# Patient Record
Sex: Female | Born: 1975
Health system: Southern US, Community
[De-identification: ages and names within clinical notes are randomized; demographics above are authoritative.]

## PROBLEM LIST (undated history)

## (undated) DIAGNOSIS — R112 Nausea with vomiting, unspecified: Secondary | ICD-10-CM

## (undated) DIAGNOSIS — Z9889 Other specified postprocedural states: Secondary | ICD-10-CM

## (undated) DIAGNOSIS — K219 Gastro-esophageal reflux disease without esophagitis: Secondary | ICD-10-CM

## (undated) DIAGNOSIS — R87629 Unspecified abnormal cytological findings in specimens from vagina: Secondary | ICD-10-CM

## (undated) DIAGNOSIS — R87619 Unspecified abnormal cytological findings in specimens from cervix uteri: Secondary | ICD-10-CM

## (undated) DIAGNOSIS — D649 Anemia, unspecified: Secondary | ICD-10-CM

## (undated) HISTORY — PX: CHOLECYSTECTOMY: SHX55

## (undated) HISTORY — PX: FOOT FRACTURE SURGERY: SHX645

## (undated) HISTORY — DX: Unspecified abnormal cytological findings in specimens from cervix uteri: R87.619

## (undated) HISTORY — PX: PILONIDAL CYST EXCISION: SHX744

---
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
## 2009-08-31 HISTORY — PX: DILATION AND CURETTAGE OF UTERUS: SHX78

## 2020-07-22 ENCOUNTER — Ambulatory Visit (INDEPENDENT_AMBULATORY_CARE_PROVIDER_SITE_OTHER): Payer: 59 | Admitting: Obstetrics and Gynecology

## 2020-07-22 ENCOUNTER — Other Ambulatory Visit: Payer: Self-pay

## 2020-07-22 ENCOUNTER — Encounter: Payer: Self-pay | Admitting: Obstetrics and Gynecology

## 2020-07-22 VITALS — BP 121/82 | HR 75 | Ht 67.0 in | Wt 209.0 lb

## 2020-07-22 DIAGNOSIS — N898 Other specified noninflammatory disorders of vagina: Secondary | ICD-10-CM

## 2020-07-22 DIAGNOSIS — R3915 Urgency of urination: Secondary | ICD-10-CM

## 2020-07-22 LAB — POCT URINALYSIS DIPSTICK: Blood, UA: NEGATIVE

## 2020-07-22 MED ORDER — NYSTATIN-TRIAMCINOLONE 100000-0.1 UNIT/GM-% EX OINT: 1.0000 "application " | TOPICAL_OINTMENT | Freq: Every day | CUTANEOUS | 1 refills | Status: DC

## 2020-07-22 NOTE — Progress Notes (Signed)
Urinary symptoms for about 10 days

## 2020-07-22 NOTE — Progress Notes (Signed)
Obstetrics and Gynecology New Patient Evaluation  Appointment Date: 07/22/2020  OBGYN Clinic: [facility] for [facility]  Primary Care Provider: No primary care provider on file.  Referring Provider: Self  Chief Complaint:  Chief Complaint  Patient presents with  . Urinary Frequency  and genital irritation  History of Present Illness: Stacy Barnes is a 44 y.o. G2P1011, seen for the above chief complaint. H  Patient with chronic history of uretheral/genital irritation. She states that she's had presumptive UTIs but only one (maybe two) actually came back positive on a urine culture. Her primary s/s is pain and irritation at the urethra with voiding. She also some pain with sexual intercourse. She saw a Dermatologist in the past and was put on clobetasol regimen that helped. S/s (uretheral) also helped with cutting down on caffeine and cola  Review of Systems: Pertinent items are noted in HPI.    Past Medical History:  Past Medical History:  Diagnosis Date  . Vaginal Pap smear, abnormal     Past Surgical History:  Past Surgical History:  Procedure Laterality Date  . DILATION AND CURETTAGE OF UTERUS  2011  . FOOT FRACTURE SURGERY    . PILONIDAL CYST EXCISION      Past Obstetrical History:  OB History  Gravida Para Term Preterm AB Living  2 1 1   1 1   SAB TAB Ectopic Multiple Live Births  1       1    # Outcome Date GA Lbr Len/2nd Weight Sex Delivery Anes PTL Lv  2 Term 11/17/11    F Vag-Spont   LIV  1 SAB 2011            Past Gynecological History: As per HPI. Periods: qmonth, regular History of Pap Smear(s): Yes.   Last pap 2020, which was negative per patient report She is currently using oral contraceptives (estrogen/progesterone) for contraception.   Social History:  Social History   Socioeconomic History  . Marital status: Married    Spouse name: Not on file  . Number of children: Not on file  . Years of education: Not on file  .  Highest education level: Not on file  Occupational History  . Not on file  Tobacco Use  . Smoking status: Never Smoker  . Smokeless tobacco: Never Used  Vaping Use  . Vaping Use: Never used  Substance and Sexual Activity  . Alcohol use: Not Currently  . Drug use: Not Currently  . Sexual activity: Yes    Birth control/protection: Pill  Other Topics Concern  . Not on file  Social History Narrative  . Not on file   Social Determinants of Health   Financial Resource Strain:   . Difficulty of Paying Living Expenses: Not on file  Food Insecurity:   . Worried About 2021 in the Last Year: Not on file  . Ran Out of Food in the Last Year: Not on file  Transportation Needs:   . Lack of Transportation (Medical): Not on file  . Lack of Transportation (Non-Medical): Not on file  Physical Activity:   . Days of Exercise per Week: Not on file  . Minutes of Exercise per Session: Not on file  Stress:   . Feeling of Stress : Not on file  Social Connections:   . Frequency of Communication with Friends and Family: Not on file  . Frequency of Social Gatherings with Friends and Family: Not on file  . Attends Religious Services: Not on  file  . Active Member of Clubs or Organizations: Not on file  . Attends Banker Meetings: Not on file  . Marital Status: Not on file  Intimate Partner Violence:   . Fear of Current or Ex-Partner: Not on file  . Emotionally Abused: Not on file  . Physically Abused: Not on file  . Sexually Abused: Not on file    Family History: History reviewed. No pertinent family history.   Medications Annie Paras had no medications administered during this visit. Current Outpatient Medications  Medication Sig Dispense Refill  . azelastine (ASTELIN) 0.1 % nasal spray Place into both nostrils 2 (two) times daily. Use in each nostril as directed    . cetirizine (ZYRTEC) 10 MG tablet Take 10 mg by mouth daily.    . fluticasone (FLONASE) 50  MCG/ACT nasal spray Place into both nostrils daily.    . norethindrone-ethinyl estradiol (LOESTRIN FE) 1-20 MG-MCG tablet Take 1 tablet by mouth daily.    Marland Kitchen omeprazole (PRILOSEC) 10 MG capsule Take 10 mg by mouth daily.     No current facility-administered medications for this visit.    Allergies Biaxin [clarithromycin] and Penicillins   Physical Exam:  BP 121/82   Pulse 75   Ht 5\' 7"  (1.702 m)   Wt 209 lb (94.8 kg)   BMI 32.73 kg/m  Body mass index is 32.73 kg/m. General appearance: Well nourished, well developed female in no acute distress.  Cardiovascular: normal s1 and s2.  No murmurs, rubs or gallops. Respiratory:  Clear to auscultation bilateral. Normal respiratory effort Abdomen: positive bowel sounds and no masses, hernias; diffusely non tender to palpation, non distended Neuro/Psych:  Normal mood and affect.  Skin:  Warm and dry.  Lymphatic:  No inguinal lymphadenopathy.   Pelvic exam: is not limited by body habitus EGBUS: urethera and peri-clitoral area erythematous and slightly ttp Vagina: appears irritated; no blood or d/c Cervix: normal appearing cervix without tenderness, discharge or lesions. Uterus:  nonenlarged and non tender Adnexa:  normal adnexa and no mass, fullness, tenderness Rectovaginal: deferred  Laboratory: poct u/a +leuks (moderate)  Radiology: none  Assessment: pt stable  Plan:  1. Vaginal,Uretheral irritation Her exam is notable for a hyperemic urethra and vaginal walls. Will send off for formal u/a and urine culture. I told her that I recommend mycolog ointment qhs until I see her back.   She did mention possible IC, which certainly may be going on, but I told her I'd recommend trying the ointment first before trying an IC diet and workup to try and not change too many things at once.  - Urinalysis, Routine w reflex microscopic - Urine Culture - NuSwab Vaginitis Plus (VG+)  Orders Placed This Encounter  Procedures  . Urine Culture  .  Microscopic Examination  . Urinalysis, Routine w reflex microscopic  . NuSwab Vaginitis Plus (VG+)  . POCT Urinalysis Dipstick   RTC 2 months for repeat exam  MD Attending Center for [facility])

## 2020-07-23 ENCOUNTER — Encounter: Payer: Self-pay | Admitting: Obstetrics and Gynecology

## 2020-07-23 LAB — URINALYSIS, ROUTINE W REFLEX MICROSCOPIC
Bilirubin, UA: NEGATIVE
Glucose, UA: NEGATIVE
Ketones, UA: NEGATIVE
Nitrite, UA: NEGATIVE
Protein,UA: NEGATIVE
RBC, UA: NEGATIVE
Specific Gravity, UA: 1.009 (ref 1.005–1.030)
Urobilinogen, Ur: 0.2 mg/dL (ref 0.2–1.0)
pH, UA: 6 (ref 5.0–7.5)

## 2020-07-23 LAB — MICROSCOPIC EXAMINATION
Bacteria, UA: NONE SEEN
Casts: NONE SEEN /lpf

## 2020-07-24 LAB — NUSWAB VAGINITIS PLUS (VG+)
Candida albicans, NAA: NEGATIVE
Candida glabrata, NAA: NEGATIVE
Chlamydia trachomatis, NAA: NEGATIVE
Neisseria gonorrhoeae, NAA: NEGATIVE
Trich vag by NAA: NEGATIVE

## 2020-07-24 LAB — URINE CULTURE

## 2020-08-06 ENCOUNTER — Encounter: Payer: Self-pay | Admitting: Radiology

## 2020-09-05 ENCOUNTER — Other Ambulatory Visit (HOSPITAL_COMMUNITY)
Admission: RE | Admit: 2020-09-05 | Discharge: 2020-09-05 | Disposition: A | Discharge status: Home / Self Care | Payer: 59 | Source: Ambulatory Visit | Attending: Obstetrics & Gynecology | Admitting: Obstetrics & Gynecology

## 2020-09-05 ENCOUNTER — Other Ambulatory Visit: Payer: Self-pay

## 2020-09-05 ENCOUNTER — Ambulatory Visit (INDEPENDENT_AMBULATORY_CARE_PROVIDER_SITE_OTHER): Payer: 59 | Admitting: Obstetrics & Gynecology

## 2020-09-05 ENCOUNTER — Encounter: Payer: Self-pay | Admitting: Obstetrics & Gynecology

## 2020-09-05 VITALS — BP 125/82 | HR 71 | Ht 67.0 in | Wt 206.4 lb

## 2020-09-05 DIAGNOSIS — N898 Other specified noninflammatory disorders of vagina: Secondary | ICD-10-CM | POA: Diagnosis present

## 2020-09-05 MED ORDER — TRIAMCINOLONE ACETONIDE 0.5 % EX CREA: 1.0000 "application " | TOPICAL_CREAM | CUTANEOUS | 3 refills | Status: DC

## 2020-09-05 NOTE — Progress Notes (Signed)
GYNECOLOGY OFFICE VISIT NOTE  History:   Stacy Barnes is a 45 y.o. G2P1011 here today for follow up after being treated for chronic vulvovaginitis by Dr. Vergie Living on 07/22/2020.  He was concerned by hyperemia of the urethra and vagina, prescribed course of Mycolog. Patient reports sone alleviation of symptoms, but still has irritation. No urinary symptoms anymore.  No other concerns.  She denies any abnormal vaginal bleeding, pelvic pain or other concerns.    Past Medical History:  Diagnosis Date  . Abnormal Pap smear of cervix     Past Surgical History:  Procedure Laterality Date  . DILATION AND CURETTAGE OF UTERUS  2011  . FOOT FRACTURE SURGERY    . PILONIDAL CYST EXCISION      The following portions of the patient's history were reviewed and updated as appropriate: allergies, current medications, past family history, past medical history, past social history, past surgical history and problem list.   Health Maintenance:  Normal pap and negative HRHPV in 2020 as per patient.    Review of Systems:  Pertinent items noted in HPI and remainder of comprehensive ROS otherwise negative.  Physical Exam:  BP 125/82   Pulse 71   Ht 5\' 7"  (1.702 m)   Wt 206 lb 6.4 oz (93.6 kg)   BMI 32.33 kg/m  CONSTITUTIONAL: Well-developed, well-nourished female in no acute distress.  HEENT:  Normocephalic, atraumatic. External right and left ear normal. No scleral icterus.  NECK: Normal range of motion, supple, no masses noted on observation SKIN: No rash noted. Not diaphoretic. No erythema. No pallor. MUSCULOSKELETAL: Normal range of motion. No edema noted. NEUROLOGIC: Alert and oriented to person, place, and time. Normal muscle tone coordination. No cranial nerve deficit noted. PSYCHIATRIC: Normal mood and affect. Normal behavior. Normal judgment and thought content. CARDIOVASCULAR: Normal heart rate noted RESPIRATORY: Effort and breath sounds normal, no problems with respiration  noted ABDOMEN: No masses noted. No other overt distention noted.   PELVIC: Some erythema noted on right side of urethra and mild erythema diffusely in vagina. Tender during exam. Thick yellow discharge seen in vagina, testing sample obtained.  Normal appearing cervix.  Performed in the presence of a chaperone.     Assessment and Plan:      1. Vaginal irritation 2. Vaginal discharge Patient reports improved symptoms.  But given persistent hyperemia, will switch to Kenalog at a higher potency for next few weeks.  If symptoms do not improve, consider switching to higher potency steroid such as Clobetasol (this has helped her in past) and can try concurrent vaginal estrogen therapy.  Symptoms could be in the spectrum of lichen chronicus simplex with atrophy; but she has no architectural distortion as is seen with lichen sclerosus. Continue to monitor. Of note, patient reported having biopsies in the past by previous GYN that were negative. Also will follow up vaginal discharge testing and manage accordingly.  - triamcinolone cream (KENALOG) 0.5 %; Apply 1 application topically 3 (three) times a week.  Dispense: 60 g; Refill: 3 - Cervicovaginal ancillary only( [place]) Routine preventative health maintenance measures emphasized. Please refer to After Visit Summary for other counseling recommendations.   Return in about 2 months (around 11/03/2020) for Followup vaginal irritation .    I spent 22 minutes dedicated to the care of this patient including pre-visit review of records, face to face time with the patient discussing her conditions and treatments and post visit ordering of testing.    01/03/2021, MD, FACOG Obstetrician &  Gynecologist, Product/process development scientist for [employer], [place]

## 2020-09-09 LAB — CERVICOVAGINAL ANCILLARY ONLY
Bacterial Vaginitis (gardnerella): NEGATIVE
Candida Glabrata: NEGATIVE
Candida Vaginitis: NEGATIVE
Comment: NEGATIVE
Comment: NEGATIVE
Comment: NEGATIVE
Comment: NEGATIVE
Trichomonas: NEGATIVE

## 2020-10-29 ENCOUNTER — Ambulatory Visit (INDEPENDENT_AMBULATORY_CARE_PROVIDER_SITE_OTHER): Payer: 59 | Admitting: Obstetrics & Gynecology

## 2020-10-29 ENCOUNTER — Other Ambulatory Visit: Payer: Self-pay

## 2020-10-29 ENCOUNTER — Encounter: Payer: Self-pay | Admitting: Obstetrics & Gynecology

## 2020-10-29 VITALS — BP 115/71 | HR 83 | Ht 67.0 in | Wt 207.8 lb

## 2020-10-29 DIAGNOSIS — Z1231 Encounter for screening mammogram for malignant neoplasm of breast: Secondary | ICD-10-CM | POA: Diagnosis not present

## 2020-10-29 DIAGNOSIS — N763 Subacute and chronic vulvitis: Secondary | ICD-10-CM | POA: Diagnosis not present

## 2020-10-29 DIAGNOSIS — N952 Postmenopausal atrophic vaginitis: Secondary | ICD-10-CM

## 2020-10-29 MED ORDER — ESTRADIOL 0.1 MG/GM VA CREA: TOPICAL_CREAM | VAGINAL | 12 refills | Status: DC

## 2020-10-29 MED ORDER — PREMARIN 0.625 MG/GM VA CREA: 1.0000 | TOPICAL_CREAM | Freq: Every day | VAGINAL | 12 refills | Status: DC

## 2020-10-29 NOTE — Patient Instructions (Signed)
Also ask about estradiol vaginal tablets.Marland Kitchenif this can be cheaper

## 2020-10-29 NOTE — Progress Notes (Signed)
   GYNECOLOGY OFFICE VISIT NOTE  History:   Stacy Barnes is a 45 y.o. G2P1011 here today for follow up for chronic vulvar inflammation. Examination findings last visit on 09/05/2020 were concerning for possible lichen chronicus simplex with atrophy. She had no architectural distortion as is seen with lichen sclerosus, and had negative biopsies in the past.  Symptoms alleviated by steroids in the past, so Kenalog was prescribed for patient to use three times a week.  She reports this helped with her symptoms, however she still reports vaginal dryness and pain with intercourse. She denies any abnormal vaginal discharge, bleeding, pelvic pain or other concerns.    Past Medical History:  Diagnosis Date  . Abnormal Pap smear of cervix     Past Surgical History:  Procedure Laterality Date  . DILATION AND CURETTAGE OF UTERUS  2011  . FOOT FRACTURE SURGERY    . PILONIDAL CYST EXCISION      The following portions of the patient's history were reviewed and updated as appropriate: allergies, current medications, past family history, past medical history, past social history, past surgical history and problem list.   Health Maintenance:  Normal pap and negative HRHPV in 11/2019 as per patient.   Review of Systems:  Pertinent items noted in HPI and remainder of comprehensive ROS otherwise negative.  Physical Exam:  BP 115/71   Pulse 83   Ht 5\' 7"  (1.702 m)   Wt 207 lb 12.8 oz (94.3 kg)   BMI 32.55 kg/m  CONSTITUTIONAL: Well-developed, well-nourished female in no acute distress.  MUSCULOSKELETAL: Normal range of motion. No edema noted. NEUROLOGIC: Alert and oriented to person, place, and time. Normal muscle tone coordination. No cranial nerve deficit noted. PSYCHIATRIC: Normal mood and affect. Normal behavior. Normal judgment and thought content. CARDIOVASCULAR: Normal heart rate noted RESPIRATORY: Effort and breath sounds normal, no problems with respiration noted ABDOMEN: No masses noted.  No other overt distention noted.   PELVIC: Mild erythema noted around urethra and introitus.  Improved from previous examination. Mild atrophy noted diffusely. Performed in the presence of a chaperone.     Assessment and Plan:      1. Chronic vulvitis Will continue Triamcinolone cream as prescribed.  2. Vaginal atrophy Counseled about trial of vaginal estrogen, this was prescribed for patient. Will evaluate response.  Should also help with pain during intercourse. - conjugated estrogens (PREMARIN) vaginal cream; Place 1 Applicatorful vaginally daily.  Dispense: 30 g; Refill: 12  3. Breast cancer screening by mammogram Mammogram ordered - MM 3D SCREEN BREAST BILATERAL; Future Routine preventative health maintenance measures emphasized, pap smear to be done next visit. Please refer to After Visit Summary for other counseling recommendations.   Return in about 8 weeks (around 12/23/2020) for Annual exam and followup.    I spent 15 minutes dedicated to the care of this patient including pre-visit review of records, face to face time with the patient discussing her conditions and treatments and post visit ordering of testing.    12/25/2020, MD, FACOG Obstetrician & Gynecologist, [facility] for [facility]

## 2020-10-30 ENCOUNTER — Other Ambulatory Visit: Payer: Self-pay | Admitting: *Deleted

## 2020-10-30 ENCOUNTER — Inpatient Hospital Stay
Admission: RE | Admit: 2020-10-30 | Discharge: 2020-10-30 | Disposition: A | Discharge status: Home / Self Care | Payer: Self-pay | Source: Ambulatory Visit | Attending: *Deleted | Admitting: *Deleted

## 2020-10-30 ENCOUNTER — Ambulatory Visit
Admission: RE | Admit: 2020-10-30 | Discharge: 2020-10-30 | Disposition: A | Discharge status: Home / Self Care | Payer: 59 | Source: Ambulatory Visit | Attending: Obstetrics & Gynecology | Admitting: Obstetrics & Gynecology

## 2020-10-30 ENCOUNTER — Other Ambulatory Visit: Payer: Self-pay

## 2020-10-30 DIAGNOSIS — Z1231 Encounter for screening mammogram for malignant neoplasm of breast: Secondary | ICD-10-CM

## 2020-11-03 ENCOUNTER — Telehealth: Payer: 59 | Admitting: Physician Assistant

## 2020-11-03 DIAGNOSIS — K2960 Other gastritis without bleeding: Secondary | ICD-10-CM

## 2020-11-03 MED ORDER — PANTOPRAZOLE SODIUM 40 MG PO TBEC: 40.0000 mg | DELAYED_RELEASE_TABLET | Freq: Every day | ORAL | 0 refills | Status: DC

## 2020-11-03 NOTE — Progress Notes (Signed)
[name], paccione are scheduled for a virtual visit with your provider today.    Just as we do with appointments in the office, we must obtain your consent to participate.  Your consent will be active for this visit and any virtual visit you may have with one of our providers in the next 365 days.    If you have a MyChart account, I can also send a copy of this consent to you electronically.  All virtual visits are billed to your insurance company just like a traditional visit in the office.  As this is a virtual visit, video technology does not allow for your provider to perform a traditional examination.  This may limit your provider's ability to fully assess your condition.  If your provider identifies any concerns that need to be evaluated in person or the need to arrange testing such as labs, EKG, etc, we will make arrangements to do so.    Although advances in technology are sophisticated, we cannot ensure that it will always work on either your end or our end.  If the connection with a video visit is poor, we may have to switch to a telephone visit.  With either a video or telephone visit, we are not always able to ensure that we have a secure connection.   I need to obtain your verbal consent now.   Are you willing to proceed with your visit today?   Stacy Barnes has provided verbal consent on 11/03/2020 for a virtual visit (video or telephone).   [address] [place], [place] 11/03/2020  3:20 PM  We are sorry that you are not feeling well.  Here is how we plan to help!  Based on what you shared with me it looks like you most likely have Gastroesophageal Reflux Disease (GERD)  Gastroesophageal reflux disease (GERD) happens when acid from your stomach flows up into the esophagus.  When acid comes in contact with the esophagus, the acid causes sorenss (inflammation) in the esophagus.  Over time, GERD may create small holes (ulcers) in the lining of the esophagus.  Stop taking NSAIDs.  I have  prescribed Pantoprazole 20 mg one by mouth daily until you follow up with a provider.  Your symptoms should improve in the next day or two.  You can use antacids as needed until symptoms resolve.  Call us if your heartburn worsens, you have trouble swallowing, weight loss, spitting up blood or recurrent vomiting.  Home Care:  May include lifestyle changes such as weight loss, quitting smoking and alcohol consumption  Avoid foods and drinks that make your symptoms worse, such as:  Caffeine or alcoholic drinks  Chocolate  Peppermint or mint flavorings  Garlic and onions  Spicy foods  Citrus fruits, such as oranges, lemons, or limes  Tomato-based foods such as sauce, chili, salsa and pizza  Fried and fatty foods  Avoid lying down for 3 hours prior to your bedtime or prior to taking a nap  Eat small, frequent meals instead of a large meals  Wear loose-fitting clothing.  Do not wear anything tight around your waist that causes pressure on your stomach.  Raise the head of your bed 6 to 8 inches with wood blocks to help you sleep.  Extra pillows will not help.  Seek Help Right Away If:  You have pain in your arms, neck, jaw, teeth or back  Your pain increases or changes in intensity or duration  You develop nausea, vomiting or sweating (diaphoresis)  You  develop shortness of breath or you faint  Your vomit is green, yellow, black or looks like coffee grounds or blood  Your stool is red, bloody or black  These symptoms could be signs of other problems, such as heart disease, gastric bleeding or esophageal bleeding.  Be sure to schedule an appointment with a primary care provider.   If you do not have a PCP, [facility] offers a free physician referral service available at [phone]. Our trained staff has the experience, knowledge and resources to put you in touch with a physician who is right for you.   You also have the option of a video visit through  [email or web address]    Make sure you :  Understand these instructions.  Will watch your condition.  Will get help right away if you are not doing well or get worse.  Your e-visit answers were reviewed by a board certified advanced clinical practitioner to complete your personal care plan.  Depending on the condition, your plan could have included both over the counter or prescription medications.  If there is a problem please reply  once you have received a response from your provider.  Your safety is important to [place].  If you have drug allergies check your prescription carefully.    You can use MyChart to ask questions about today's visit, request a non-urgent call back, or ask for a work or school excuse for 24 hours related to this e-Visit. If it has been greater than 24 hours you will need to follow up with your provider, or enter a new e-Visit to address those concerns.  You will get an e-mail in the next two days asking about your experience.  I hope that your e-visit has been valuable and will speed your recovery. Thank you for using e-visits.     Greater than 5 minutes, yet less than 10 minutes of time have been spent researching, coordinating and implementing care for this patient today.

## 2020-11-20 ENCOUNTER — Ambulatory Visit: Payer: 59 | Admitting: Internal Medicine

## 2020-11-20 ENCOUNTER — Encounter: Payer: Self-pay | Admitting: Internal Medicine

## 2020-11-20 ENCOUNTER — Ambulatory Visit (INDEPENDENT_AMBULATORY_CARE_PROVIDER_SITE_OTHER): Payer: 59 | Admitting: Internal Medicine

## 2020-11-20 ENCOUNTER — Other Ambulatory Visit: Payer: Self-pay

## 2020-11-20 VITALS — BP 124/72 | HR 82 | Ht 67.0 in | Wt 207.4 lb

## 2020-11-20 DIAGNOSIS — E8881 Metabolic syndrome: Secondary | ICD-10-CM

## 2020-11-20 DIAGNOSIS — K2100 Gastro-esophageal reflux disease with esophagitis, without bleeding: Secondary | ICD-10-CM | POA: Diagnosis not present

## 2020-11-20 MED ORDER — PANTOPRAZOLE SODIUM 40 MG PO TBEC
40.0000 mg | DELAYED_RELEASE_TABLET | Freq: Every day | ORAL | 4 refills | Status: AC
Start: 2020-11-20 — End: ?

## 2020-11-20 NOTE — Progress Notes (Signed)
New Patient Office Visit  Subjective:  Patient ID: Stacy Barnes, female    DOB: Nov 04, 1975  Age: 44 y.o. MRN: [id number]  CC:  Chief Complaint  Patient presents with  . New Patient (Initial Visit)    Patient here today to establish care and has complaints of acid reflux    Heartburn She complains of heartburn and water brash. She reports no abdominal pain, no belching, no chest pain, no choking, no coughing, no dysphagia or no wheezing. This is a recurrent problem. The heartburn duration is more than one hour. The heartburn is located in the substernum. The heartburn is of moderate intensity. The heartburn wakes her from sleep. The heartburn changes with position. Risk factors include obesity. She has tried head elevation, an antacid and a diet change for the symptoms.     Past Medical History:  Diagnosis Date  . Abnormal Pap smear of cervix      Current Outpatient Medications:  .  azelastine (ASTELIN) 0.1 % nasal spray, Place into both nostrils 2 (two) times daily. Use in each nostril as directed, Disp: , Rfl:  .  cetirizine (ZYRTEC) 10 MG tablet, Take 10 mg by mouth daily., Disp: , Rfl:  .  fluticasone (FLONASE) 50 MCG/ACT nasal spray, Place into both nostrils daily., Disp: , Rfl:  .  norethindrone-ethinyl estradiol (LOESTRIN FE) 1-20 MG-MCG tablet, Take 1 tablet by mouth daily., Disp: , Rfl:  .  triamcinolone cream (KENALOG) 0.5 %, Apply 1 application topically 3 (three) times a week., Disp: 60 g, Rfl: 3 .  conjugated estrogens (PREMARIN) vaginal cream, Place 1 Applicatorful vaginally daily., Disp: 30 g, Rfl: 12 .  pantoprazole (PROTONIX) 40 MG tablet, Take 1 tablet (40 mg total) by mouth daily., Disp: 60 tablet, Rfl: 4   Past Surgical History:  Procedure Laterality Date  . DILATION AND CURETTAGE OF UTERUS  2011  . FOOT FRACTURE SURGERY    . PILONIDAL CYST EXCISION      Family History  Problem Relation Age of Onset  . Breast cancer Maternal Aunt 79       died at age  58    Social History   Socioeconomic History  . Marital status: Married    Spouse name: Not on file  . Number of children: Not on file  . Years of education: Not on file  . Highest education level: Not on file  Occupational History  . Not on file  Tobacco Use  . Smoking status: Never Smoker  . Smokeless tobacco: Never Used  Vaping Use  . Vaping Use: Never used  Substance and Sexual Activity  . Alcohol use: Not Currently  . Drug use: Not Currently  . Sexual activity: Yes    Birth control/protection: Pill  Other Topics Concern  . Not on file  Social History Narrative  . Not on file   Social Determinants of Health   Financial Resource Strain: Not on file  Food Insecurity: Not on file  Transportation Needs: Not on file  Physical Activity: Not on file  Stress: Not on file  Social Connections: Not on file  Intimate Partner Violence: Not on file    ROS Review of Systems  Constitutional: Negative.   HENT: Negative.   Eyes: Negative.   Respiratory: Negative.  Negative for cough, choking and wheezing.   Cardiovascular: Negative.  Negative for chest pain.  Gastrointestinal: Positive for heartburn. Negative for abdominal pain and dysphagia.  Endocrine: Negative.   Genitourinary: Negative.   Musculoskeletal: Negative.  Skin: Negative.   Allergic/Immunologic: Negative.   Neurological: Negative.   Hematological: Negative.   Psychiatric/Behavioral: Negative.   All other systems reviewed and are negative.   Objective:   Today's Vitals: BP 124/72   Pulse 82   Ht 5\' 7"  (1.702 m)   Wt 207 lb 6.4 oz (94.1 kg)   BMI 32.48 kg/m   Physical Exam Constitutional:      Appearance: She is normal weight.  HENT:     Right Ear: Tympanic membrane normal.     Left Ear: Tympanic membrane normal.     Mouth/Throat:     Mouth: Mucous membranes are moist.  Eyes:     Pupils: Pupils are equal, round, and reactive to light.  Cardiovascular:     Rate and Rhythm: Normal rate.      Heart sounds: No murmur heard. No gallop.   Pulmonary:     Effort: No respiratory distress.     Breath sounds: No wheezing.  Abdominal:     General: Abdomen is flat.     Palpations: There is no mass.  Musculoskeletal:        General: Normal range of motion.  Skin:    General: Skin is warm.  Neurological:     General: No focal deficit present.     Mental Status: She is alert.  Psychiatric:        Mood and Affect: Mood normal.     Assessment & Plan:   Problem List Items Addressed This Visit      Digestive   Gastroesophageal reflux disease with esophagitis without hemorrhage - Primary    - The patient's GERD is stable on medication.  - Instructed the patient to avoid eating spicy and acidic foods, as well as foods high in fat. - Instructed the patient to avoid eating large meals or meals 2-3 hours prior to sleeping.      Relevant Medications   pantoprazole (PROTONIX) 40 MG tablet     Other   Metabolic syndrome    - I encouraged the patient to lose weight.  - I educated them on making healthy dietary choices including eating more fruits and vegetables and less fried foods. - I encouraged the patient to exercise more, and educated on the benefits of exercise including weight loss, diabetes prevention, and hypertension prevention.           Outpatient Encounter Medications as of 11/20/2020  Medication Sig  . azelastine (ASTELIN) 0.1 % nasal spray Place into both nostrils 2 (two) times daily. Use in each nostril as directed  . cetirizine (ZYRTEC) 10 MG tablet Take 10 mg by mouth daily.  . fluticasone (FLONASE) 50 MCG/ACT nasal spray Place into both nostrils daily.  . norethindrone-ethinyl estradiol (LOESTRIN FE) 1-20 MG-MCG tablet Take 1 tablet by mouth daily.  11/22/2020 triamcinolone cream (KENALOG) 0.5 % Apply 1 application topically 3 (three) times a week.  . [DISCONTINUED] meloxicam (MOBIC) 7.5 MG tablet Take 7.5 mg by mouth daily.  . [DISCONTINUED] omeprazole (PRILOSEC) 10  MG capsule Take 10 mg by mouth daily.  . [DISCONTINUED] pantoprazole (PROTONIX) 40 MG tablet Take 1 tablet (40 mg total) by mouth daily.  Marland Kitchen conjugated estrogens (PREMARIN) vaginal cream Place 1 Applicatorful vaginally daily.  . pantoprazole (PROTONIX) 40 MG tablet Take 1 tablet (40 mg total) by mouth daily.   No facility-administered encounter medications on file as of 11/20/2020.    Follow-up: No follow-ups on file.   11/22/2020, MD

## 2020-11-20 NOTE — Assessment & Plan Note (Signed)
-   The patient's GERD is stable on medication.  - Instructed the patient to avoid eating spicy and acidic foods, as well as foods high in fat. - Instructed the patient to avoid eating large meals or meals 2-3 hours prior to sleeping. 

## 2020-11-20 NOTE — Assessment & Plan Note (Signed)
-   I encouraged the patient to lose weight.  - I educated them on making healthy dietary choices including eating more fruits and vegetables and less fried foods. - I encouraged the patient to exercise more, and educated on the benefits of exercise including weight loss, diabetes prevention, and hypertension prevention.   

## 2020-11-21 ENCOUNTER — Other Ambulatory Visit: Payer: Self-pay | Admitting: *Deleted

## 2020-11-21 DIAGNOSIS — N898 Other specified noninflammatory disorders of vagina: Secondary | ICD-10-CM

## 2020-11-21 MED ORDER — NORETHIN ACE-ETH ESTRAD-FE 1-20 MG-MCG PO TABS: 1.0000 | ORAL_TABLET | Freq: Every day | ORAL | 2 refills | Status: DC

## 2020-11-28 ENCOUNTER — Other Ambulatory Visit: Payer: Self-pay

## 2020-11-28 ENCOUNTER — Other Ambulatory Visit (INDEPENDENT_AMBULATORY_CARE_PROVIDER_SITE_OTHER): Payer: 59

## 2020-11-28 DIAGNOSIS — E8881 Metabolic syndrome: Secondary | ICD-10-CM | POA: Diagnosis not present

## 2020-11-29 ENCOUNTER — Encounter: Payer: Self-pay | Admitting: *Deleted

## 2020-11-29 LAB — COMPLETE METABOLIC PANEL WITH GFR
AG Ratio: 1.6 (calc) (ref 1.0–2.5)
ALT: 9 U/L (ref 6–29)
AST: 10 U/L (ref 10–30)
Albumin: 3.9 g/dL (ref 3.6–5.1)
Alkaline phosphatase (APISO): 91 U/L (ref 31–125)
BUN: 8 mg/dL (ref 7–25)
CO2: 22 mmol/L (ref 20–32)
Calcium: 9.3 mg/dL (ref 8.6–10.2)
Chloride: 105 mmol/L (ref 98–110)
Creat: 0.87 mg/dL (ref 0.50–1.10)
GFR, Est African American: 95 mL/min/{1.73_m2} (ref >=60)
GFR, Est Non African American: 82 mL/min/{1.73_m2} (ref >=60)
Globulin: 2.5 g/dL (calc) (ref 1.9–3.7)
Glucose, Bld: 82 mg/dL (ref 65–99)
Potassium: 4 mmol/L (ref 3.5–5.3)
Sodium: 138 mmol/L (ref 135–146)
Total Bilirubin: 0.5 mg/dL (ref 0.2–1.2)
Total Protein: 6.4 g/dL (ref 6.1–8.1)

## 2020-11-29 LAB — LIPID PANEL
Cholesterol: 197 mg/dL (ref <200)
HDL: 50 mg/dL (ref >=50)
LDL Cholesterol (Calc): 123 mg/dL (calc) — ABNORMAL HIGH
Non-HDL Cholesterol (Calc): 147 mg/dL (calc) — ABNORMAL HIGH (ref <130)
Total CHOL/HDL Ratio: 3.9 (calc) (ref <5.0)
Triglycerides: 127 mg/dL (ref <150)

## 2020-11-29 LAB — CBC WITH DIFFERENTIAL/PLATELET
Absolute Monocytes: 433 cells/uL (ref 200–950)
Basophils Absolute: 30 cells/uL (ref 0–200)
Basophils Relative: 0.4 %
Eosinophils Absolute: 228 cells/uL (ref 15–500)
Eosinophils Relative: 3 %
HCT: 37.6 % (ref 35.0–45.0)
Hemoglobin: 12.2 g/dL (ref 11.7–15.5)
Lymphs Abs: 2918 cells/uL (ref 850–3900)
MCH: 28 pg (ref 27.0–33.0)
MCHC: 32.4 g/dL (ref 32.0–36.0)
MCV: 86.2 fL (ref 80.0–100.0)
MPV: 11.6 fL (ref 7.5–12.5)
Monocytes Relative: 5.7 %
Neutro Abs: 3990 cells/uL (ref 1500–7800)
Neutrophils Relative %: 52.5 %
Platelets: 276 10*3/uL (ref 140–400)
RBC: 4.36 10*6/uL (ref 3.80–5.10)
RDW: 12.3 % (ref 11.0–15.0)
Total Lymphocyte: 38.4 %
WBC: 7.6 10*3/uL (ref 3.8–10.8)

## 2020-11-29 LAB — TSH: TSH: 3.09 mIU/L

## 2020-12-04 ENCOUNTER — Other Ambulatory Visit: Payer: Self-pay

## 2020-12-04 ENCOUNTER — Ambulatory Visit (INDEPENDENT_AMBULATORY_CARE_PROVIDER_SITE_OTHER): Payer: 59 | Admitting: Internal Medicine

## 2020-12-04 ENCOUNTER — Encounter: Payer: Self-pay | Admitting: Internal Medicine

## 2020-12-04 VITALS — BP 104/63 | HR 72 | Ht 67.0 in | Wt 208.1 lb

## 2020-12-04 DIAGNOSIS — K2100 Gastro-esophageal reflux disease with esophagitis, without bleeding: Secondary | ICD-10-CM | POA: Diagnosis not present

## 2020-12-04 DIAGNOSIS — E8881 Metabolic syndrome: Secondary | ICD-10-CM

## 2020-12-04 DIAGNOSIS — J301 Allergic rhinitis due to pollen: Secondary | ICD-10-CM | POA: Diagnosis not present

## 2020-12-04 NOTE — Assessment & Plan Note (Signed)
-   The patient's GERD is stable on medication.  - Instructed the patient to avoid eating spicy and acidic foods, as well as foods high in fat. - Instructed the patient to avoid eating large meals or meals 2-3 hours prior to sleeping. 

## 2020-12-04 NOTE — Assessment & Plan Note (Signed)
Take Claritin 10 mg p.o. daily in the season

## 2020-12-04 NOTE — Assessment & Plan Note (Signed)

## 2020-12-04 NOTE — Progress Notes (Signed)
Established Patient Office Visit  Subjective:  Patient ID: Stacy Barnes, female    DOB: 1976-02-28  Age: 45 y.o. MRN: [id number]  CC:  Chief Complaint  Patient presents with  . follow up lab results     HPI  Stacy Barnes presents for general checkup. Past Medical History:  Diagnosis Date  . Abnormal Pap smear of cervix     Past Surgical History:  Procedure Laterality Date  . DILATION AND CURETTAGE OF UTERUS  2011  . FOOT FRACTURE SURGERY    . PILONIDAL CYST EXCISION      Family History  Problem Relation Age of Onset  . Breast cancer Maternal Aunt 44       died at age 75    Social History   Socioeconomic History  . Marital status: Married    Spouse name: Not on file  . Number of children: Not on file  . Years of education: Not on file  . Highest education level: Not on file  Occupational History  . Not on file  Tobacco Use  . Smoking status: Never Smoker  . Smokeless tobacco: Never Used  Vaping Use  . Vaping Use: Never used  Substance and Sexual Activity  . Alcohol use: Not Currently  . Drug use: Not Currently  . Sexual activity: Yes    Birth control/protection: Pill  Other Topics Concern  . Not on file  Social History Narrative  . Not on file   Social Determinants of Health   Financial Resource Strain: Not on file  Food Insecurity: Not on file  Transportation Needs: Not on file  Physical Activity: Not on file  Stress: Not on file  Social Connections: Not on file  Intimate Partner Violence: Not on file     Current Outpatient Medications:  .  azelastine (ASTELIN) 0.1 % nasal spray, Place into both nostrils 2 (two) times daily. Use in each nostril as directed, Disp: , Rfl:  .  cetirizine (ZYRTEC) 10 MG tablet, Take 10 mg by mouth daily., Disp: , Rfl:  .  fluticasone (FLONASE) 50 MCG/ACT nasal spray, Place into both nostrils daily., Disp: , Rfl:  .  norethindrone-ethinyl estradiol (LOESTRIN FE) 1-20 MG-MCG tablet, Take 1 tablet by mouth  daily., Disp: 28 tablet, Rfl: 2 .  pantoprazole (PROTONIX) 40 MG tablet, Take 1 tablet (40 mg total) by mouth daily., Disp: 60 tablet, Rfl: 4 .  triamcinolone cream (KENALOG) 0.5 %, Apply 1 application topically 3 (three) times a week., Disp: 60 g, Rfl: 3 .  conjugated estrogens (PREMARIN) vaginal cream, Place 1 Applicatorful vaginally daily., Disp: 30 g, Rfl: 12   Allergies  Allergen Reactions  . Biaxin [Clarithromycin] Nausea And Vomiting  . Penicillins Rash    ROS Review of Systems  Constitutional: Negative.   HENT: Negative.   Eyes: Negative.   Respiratory: Negative.   Cardiovascular: Negative.   Gastrointestinal: Negative.   Endocrine: Negative.   Genitourinary: Negative.   Musculoskeletal: Negative.   Skin: Negative.   Allergic/Immunologic: Negative.   Neurological: Negative.   Hematological: Negative.   Psychiatric/Behavioral: Negative.   All other systems reviewed and are negative.     Objective:    Physical Exam Vitals reviewed.  Constitutional:      Appearance: Normal appearance.  HENT:     Mouth/Throat:     Mouth: Mucous membranes are moist.  Eyes:     Pupils: Pupils are equal, round, and reactive to light.  Neck:     Vascular: No carotid bruit.  Cardiovascular:     Rate and Rhythm: Normal rate and regular rhythm.     Pulses: Normal pulses.     Heart sounds: Normal heart sounds.  Pulmonary:     Effort: Pulmonary effort is normal.     Breath sounds: Normal breath sounds.  Abdominal:     General: Bowel sounds are normal.     Palpations: Abdomen is soft. There is no hepatomegaly, splenomegaly or mass.     Tenderness: There is no abdominal tenderness.     Hernia: No hernia is present.  Musculoskeletal:        General: No tenderness.     Cervical back: Neck supple.     Right lower leg: No edema.     Left lower leg: No edema.  Skin:    Findings: No rash.  Neurological:     Mental Status: She is alert and oriented to person, place, and time.      Motor: No weakness.  Psychiatric:        Mood and Affect: Mood and affect normal.        Behavior: Behavior normal.     BP 104/63   Pulse 72   Ht 5\' 7"  (1.702 m)   Wt 208 lb 1.6 oz (94.4 kg)   BMI 32.59 kg/m  Wt Readings from Last 3 Encounters:  12/04/20 208 lb 1.6 oz (94.4 kg)  11/20/20 207 lb 6.4 oz (94.1 kg)  10/29/20 207 lb 12.8 oz (94.3 kg)     There are no preventive care reminders to display for this patient.  There are no preventive care reminders to display for this patient.  Lab Results  Component Value Date   TSH 3.09 11/28/2020   Lab Results  Component Value Date   WBC 7.6 11/28/2020   HGB 12.2 11/28/2020   HCT 37.6 11/28/2020   MCV 86.2 11/28/2020   PLT 276 11/28/2020   Lab Results  Component Value Date   NA 138 11/28/2020   K 4.0 11/28/2020   CO2 22 11/28/2020   GLUCOSE 82 11/28/2020   BUN 8 11/28/2020   CREATININE 0.87 11/28/2020   BILITOT 0.5 11/28/2020   AST 10 11/28/2020   ALT 9 11/28/2020   PROT 6.4 11/28/2020   CALCIUM 9.3 11/28/2020   Lab Results  Component Value Date   CHOL 197 11/28/2020   Lab Results  Component Value Date   HDL 50 11/28/2020   Lab Results  Component Value Date   LDLCALC 123 (H) 11/28/2020   Lab Results  Component Value Date   TRIG 127 11/28/2020   Lab Results  Component Value Date   CHOLHDL 3.9 11/28/2020   No results found for: HGBA1C    Assessment & Plan:   Problem List Items Addressed This Visit      Respiratory   Seasonal allergic rhinitis due to pollen    Take Claritin 10 mg p.o. daily in the season        Digestive   Gastroesophageal reflux disease with esophagitis without hemorrhage - Primary    - The patient's GERD is stable on medication.  - Instructed the patient to avoid eating spicy and acidic foods, as well as foods high in fat. - Instructed the patient to avoid eating large meals or meals 2-3 hours prior to sleeping.        Other   Metabolic syndrome    - I encouraged  the patient to lose weight.  - I educated them on making healthy dietary  choices including eating more fruits and vegetables and less fried foods. - I encouraged the patient to exercise more, and educated on the benefits of exercise including weight loss, diabetes prevention, and hypertension prevention.   Dietary counseling with a registered dietician  Referral to a weight management support group (e.g. Weight Watchers, Overeaters Anonymous)  If your BMI is greater than 29 or you have gained more than 15 pounds you should work on weight loss.  Attend a healthy cooking class        Hypercholesterolemia  I advised the patient to follow Mediterranean diet This diet is rich in fruits vegetables and whole grain, and This diet is also rich in fish and lean meat Patient should also eat a handful of almonds or walnuts daily Recent heart study indicated that average follow-up on this kind of diet reduces the cardiovascular mortality by 50 to 70%==  No orders of the defined types were placed in this encounter.   Follow-up: No follow-ups on file.    Corky Downs, MD

## 2020-12-24 ENCOUNTER — Other Ambulatory Visit (HOSPITAL_COMMUNITY)
Admission: RE | Admit: 2020-12-24 | Discharge: 2020-12-24 | Disposition: A | Discharge status: Home / Self Care | Payer: 59 | Source: Ambulatory Visit | Attending: Obstetrics & Gynecology | Admitting: Obstetrics & Gynecology

## 2020-12-24 ENCOUNTER — Ambulatory Visit (INDEPENDENT_AMBULATORY_CARE_PROVIDER_SITE_OTHER): Payer: 59 | Admitting: Obstetrics & Gynecology

## 2020-12-24 ENCOUNTER — Other Ambulatory Visit: Payer: Self-pay

## 2020-12-24 ENCOUNTER — Encounter: Payer: Self-pay | Admitting: Obstetrics & Gynecology

## 2020-12-24 VITALS — BP 131/83 | HR 76 | Ht 67.0 in | Wt 208.4 lb

## 2020-12-24 DIAGNOSIS — N898 Other specified noninflammatory disorders of vagina: Secondary | ICD-10-CM | POA: Insufficient documentation

## 2020-12-24 DIAGNOSIS — N763 Subacute and chronic vulvitis: Secondary | ICD-10-CM

## 2020-12-24 DIAGNOSIS — Z3041 Encounter for surveillance of contraceptive pills: Secondary | ICD-10-CM | POA: Diagnosis not present

## 2020-12-24 DIAGNOSIS — Z124 Encounter for screening for malignant neoplasm of cervix: Secondary | ICD-10-CM

## 2020-12-24 MED ORDER — TRIAMCINOLONE ACETONIDE 0.5 % EX CREA: 1.0000 "application " | TOPICAL_CREAM | CUTANEOUS | 5 refills | Status: AC

## 2020-12-24 MED ORDER — NORETHIN ACE-ETH ESTRAD-FE 1-20 MG-MCG PO TABS: 1.0000 | ORAL_TABLET | Freq: Every day | ORAL | 5 refills | Status: DC

## 2020-12-24 NOTE — Progress Notes (Signed)
   GYNECOLOGY OFFICE VISIT NOTE  History:   Stacy Barnes is a 45 y.o. G2P1011 here today for one episode of vaginal irritation noted during Kenalog application.  Felt irritated on the inside, not the outside. Had similar reaction but on inside and outside when she tried estrogen cream, so she discontinued this.  For the Kenalog, symptoms improved when she wiped it off the inside part.  She denies any abnormal vaginal discharge, bleeding, pelvic pain or other concerns. Also desires BC refill.    Past Medical History:  Diagnosis Date  . Abnormal Pap smear of cervix     Past Surgical History:  Procedure Laterality Date  . DILATION AND CURETTAGE OF UTERUS  2011  . FOOT FRACTURE SURGERY    . PILONIDAL CYST EXCISION      The following portions of the patient's history were reviewed and updated as appropriate: allergies, current medications, past family history, past medical history, past social history, past surgical history and problem list.   Health Maintenance:  Normal pap and negative HRHPV on 12/05/2019.  Normal mammogram on 11/01/2020.   Review of Systems:  Pertinent items noted in HPI and remainder of comprehensive ROS otherwise negative.  Physical Exam:  BP 131/83   Pulse 76   Ht 5\' 7"  (1.702 m)   Wt 208 lb 6.4 oz (94.5 kg)   BMI 32.64 kg/m  CONSTITUTIONAL: Well-developed, well-nourished female in no acute distress.  HEENT:  Normocephalic, atraumatic. External right and left ear normal. No scleral icterus.  NECK: Normal range of motion, supple, no masses noted on observation SKIN: No rash noted. Not diaphoretic. No erythema. No pallor. MUSCULOSKELETAL: Normal range of motion. No edema noted. NEUROLOGIC: Alert and oriented to person, place, and time. Normal muscle tone coordination. No cranial nerve deficit noted. PSYCHIATRIC: Normal mood and affect. Normal behavior. Normal judgment and thought content. CARDIOVASCULAR: Normal heart rate noted RESPIRATORY: Effort and breath  sounds normal, no problems with respiration noted ABDOMEN: No masses noted. No other overt distention noted.   PELVIC: Very mild erythema noted around urethra and introitus, markedly improved from previous examinations. Mild atrophy noted diffusely. Yellow, thin discharge noted in distal vagina, testing sample obtained. Performed in the presence of a chaperone.    Assessment and Plan:      1. Vaginal irritation 2. Chronic vulvitis Possible vaginitis episode, will follow up results and manage accordingly. Will continue external application of Kenalog for now, can reduce to two times a week if needed.  - triamcinolone cream (KENALOG) 0.5 %; Apply 1 application topically 3 (three) times a week.  Dispense: 60 g; Refill: 5 - Cervicovaginal ancillary only  3. Uses oral contraception OCPs refilled - norethindrone-ethinyl estradiol (LOESTRIN FE) 1-20 MG-MCG tablet; Take 1 tablet by mouth daily.  Dispense: 84 tablet; Refill: 5  4. Pap smear for cervical cancer screening Up to date on pap smear screening. Result abstracted from outside records today.  - Pap IG and HPV (high risk) DNA detection Routine preventative health maintenance measures emphasized. Please refer to After Visit Summary for other counseling recommendations.   Return for any gynecologic concerns.    I spent 18 minutes dedicated to the care of this patient including pre-visit review of records, face to face time with the patient discussing her conditions and treatments and post visit ordering of testing.    , MD, FACOG Obstetrician & Gynecologist, [facility]

## 2020-12-25 LAB — CERVICOVAGINAL ANCILLARY ONLY
Bacterial Vaginitis (gardnerella): NEGATIVE
Candida Glabrata: NEGATIVE
Candida Vaginitis: NEGATIVE
Comment: NEGATIVE
Comment: NEGATIVE
Comment: NEGATIVE
Comment: NEGATIVE
Trichomonas: NEGATIVE

## 2021-02-13 ENCOUNTER — Ambulatory Visit (HOSPITAL_COMMUNITY)
Admission: EM | Admit: 2021-02-13 | Discharge: 2021-02-13 | Disposition: A | Discharge status: Home / Self Care | Payer: 59 | Attending: Urgent Care | Admitting: Urgent Care

## 2021-02-13 ENCOUNTER — Other Ambulatory Visit: Payer: Self-pay

## 2021-02-13 ENCOUNTER — Encounter (HOSPITAL_COMMUNITY): Payer: Self-pay

## 2021-02-13 DIAGNOSIS — W5503XA Scratched by cat, initial encounter: Secondary | ICD-10-CM

## 2021-02-13 DIAGNOSIS — M25532 Pain in left wrist: Secondary | ICD-10-CM

## 2021-02-13 DIAGNOSIS — S50812A Abrasion of left forearm, initial encounter: Secondary | ICD-10-CM

## 2021-02-13 MED ORDER — AZITHROMYCIN 250 MG PO TABS: ORAL_TABLET | ORAL | 0 refills | Status: DC

## 2021-02-13 NOTE — ED Provider Notes (Signed)
St[facility]   MRN: [id number] DOB: 10-08-1975  Subjective:   Stacy Barnes is a 45 y.o. female presenting for suffering a cat bite this morning.  Patient states that it was from a kitten from an outdoor cat that she does not know.  Cat scratched her on her left hand.  She has kept it clean and covered.  Has concerns about rabies and other infections.  Denies fever, nausea, vomiting, drainage of pus or bleeding.  No current facility-administered medications for this encounter.  Current Outpatient Medications:    azelastine (ASTELIN) 0.1 % nasal spray, Place into both nostrils 2 (two) times daily. Use in each nostril as directed, Disp: , Rfl:    cetirizine (ZYRTEC) 10 MG tablet, Take 10 mg by mouth daily., Disp: , Rfl:    fluticasone (FLONASE) 50 MCG/ACT nasal spray, Place into both nostrils daily., Disp: , Rfl:    norethindrone-ethinyl estradiol (LOESTRIN FE) 1-20 MG-MCG tablet, Take 1 tablet by mouth daily., Disp: 84 tablet, Rfl: 5   pantoprazole (PROTONIX) 40 MG tablet, Take 1 tablet (40 mg total) by mouth daily., Disp: 60 tablet, Rfl: 4   triamcinolone cream (KENALOG) 0.5 %, Apply 1 application topically 3 (three) times a week., Disp: 60 g, Rfl: 5   Allergies  Allergen Reactions   Biaxin [Clarithromycin] Nausea And Vomiting   Penicillins Rash    Past Medical History:  Diagnosis Date   Abnormal Pap smear of cervix      Past Surgical History:  Procedure Laterality Date   DILATION AND CURETTAGE OF UTERUS  2011   FOOT FRACTURE SURGERY     PILONIDAL CYST EXCISION      Family History  Problem Relation Age of Onset   Breast cancer Maternal Aunt 100       died at age 64    Social History   Tobacco Use   Smoking status: Never   Smokeless tobacco: Never  Vaping Use   Vaping Use: Never used  Substance Use Topics   Alcohol use: Not Currently   Drug use: Not Currently    ROS   Objective:   Vitals: BP 122/84 (BP Location: Right Arm)   Pulse 71    Temp 98.9 F (37.2 C) (Oral)   Resp 16   SpO2 98%   Physical Exam Constitutional:      General: She is not in acute distress.    Appearance: Normal appearance. She is well-developed. She is not ill-appearing, toxic-appearing or diaphoretic.  HENT:     Head: Normocephalic and atraumatic.     Nose: Nose normal.     Mouth/Throat:     Mouth: Mucous membranes are moist.     Pharynx: Oropharynx is clear.  Eyes:     General: No scleral icterus.    Extraocular Movements: Extraocular movements intact.     Pupils: Pupils are equal, round, and reactive to light.  Cardiovascular:     Rate and Rhythm: Normal rate.  Pulmonary:     Effort: Pulmonary effort is normal.  Chest:  Breasts:    Right: No axillary adenopathy or supraclavicular adenopathy.     Left: No axillary adenopathy or supraclavicular adenopathy.  Musculoskeletal:       Hands:  Lymphadenopathy:     Upper Body:     Right upper body: No supraclavicular, axillary, pectoral or epitrochlear adenopathy.     Left upper body: No supraclavicular, axillary, pectoral or epitrochlear adenopathy.  Skin:    General: Skin is warm and  dry.  Neurological:     General: No focal deficit present.     Mental Status: She is alert and oriented to person, place, and time.  Psychiatric:        Mood and Affect: Mood normal.        Behavior: Behavior normal.        Thought Content: Thought content normal.        Judgment: Judgment normal.      Assessment and Plan :   PDMP not reviewed this encounter.  1. Left wrist pain   2. Cat scratch of forearm, left, initial encounter     Given that this was a juvenile cat that likely has vaccinations we discussed possibility of rabies.  I offered her prescription for azithromycin for cat scratch disease.  Recommended supportive care, discussed wound care otherwise. Counseled patient on potential for adverse effects with medications prescribed/recommended today, ER and return-to-clinic precautions  discussed, patient verbalized understanding.    Stacy Barnes, [place] 02/17/21 [phone]

## 2021-02-13 NOTE — ED Triage Notes (Signed)
Pt reports she was scratched by a outdoor cat in the left hand this morning.   Pt reports she had an allergy test dione today.

## 2021-03-12 ENCOUNTER — Emergency Department: Payer: 59

## 2021-03-12 ENCOUNTER — Emergency Department
Admission: EM | Admit: 2021-03-12 | Discharge: 2021-03-12 | Disposition: A | Discharge status: Home / Self Care | Payer: 59 | Attending: Emergency Medicine | Admitting: Emergency Medicine

## 2021-03-12 ENCOUNTER — Encounter: Payer: Self-pay | Admitting: Emergency Medicine

## 2021-03-12 ENCOUNTER — Other Ambulatory Visit: Payer: Self-pay

## 2021-03-12 DIAGNOSIS — R197 Diarrhea, unspecified: Secondary | ICD-10-CM | POA: Diagnosis not present

## 2021-03-12 DIAGNOSIS — R1013 Epigastric pain: Secondary | ICD-10-CM | POA: Diagnosis not present

## 2021-03-12 DIAGNOSIS — K219 Gastro-esophageal reflux disease without esophagitis: Secondary | ICD-10-CM | POA: Insufficient documentation

## 2021-03-12 DIAGNOSIS — R1011 Right upper quadrant pain: Secondary | ICD-10-CM | POA: Insufficient documentation

## 2021-03-12 DIAGNOSIS — R001 Bradycardia, unspecified: Secondary | ICD-10-CM | POA: Insufficient documentation

## 2021-03-12 DIAGNOSIS — R112 Nausea with vomiting, unspecified: Secondary | ICD-10-CM | POA: Insufficient documentation

## 2021-03-12 LAB — CBC
HCT: 39.5 % (ref 36.0–46.0)
Hemoglobin: 13 g/dL (ref 12.0–15.0)
MCH: 27.9 pg (ref 26.0–34.0)
MCHC: 32.9 g/dL (ref 30.0–36.0)
MCV: 84.8 fL (ref 80.0–100.0)
Platelets: 314 10*3/uL (ref 150–400)
RBC: 4.66 MIL/uL (ref 3.87–5.11)
RDW: 12.4 % (ref 11.5–15.5)
WBC: 12.4 10*3/uL — ABNORMAL HIGH (ref 4.0–10.5)
nRBC: 0 % (ref 0.0–0.2)

## 2021-03-12 LAB — URINALYSIS, COMPLETE (UACMP) WITH MICROSCOPIC
Bilirubin Urine: NEGATIVE
Glucose, UA: NEGATIVE mg/dL
Ketones, ur: 80 mg/dL — AB
Nitrite: NEGATIVE
Protein, ur: NEGATIVE mg/dL
Specific Gravity, Urine: 1.016 (ref 1.005–1.030)
pH: 5 (ref 5.0–8.0)

## 2021-03-12 LAB — COMPREHENSIVE METABOLIC PANEL
ALT: 18 U/L (ref 0–44)
AST: 16 U/L (ref 15–41)
Albumin: 4.1 g/dL (ref 3.5–5.0)
Alkaline Phosphatase: 86 U/L (ref 38–126)
Anion gap: 13 (ref 5–15)
BUN: 8 mg/dL (ref 6–20)
CO2: 22 mmol/L (ref 22–32)
Calcium: 9.2 mg/dL (ref 8.9–10.3)
Chloride: 103 mmol/L (ref 98–111)
Creatinine, Ser: 0.77 mg/dL (ref 0.44–1.00)
GFR, Estimated: >60 mL/min (ref >60)
Glucose, Bld: 93 mg/dL (ref 70–99)
Potassium: 3.8 mmol/L (ref 3.5–5.1)
Sodium: 138 mmol/L (ref 135–145)
Total Bilirubin: 0.8 mg/dL (ref 0.3–1.2)
Total Protein: 7.9 g/dL (ref 6.5–8.1)

## 2021-03-12 LAB — LIPASE, BLOOD: Lipase: 46 U/L (ref 11–51)

## 2021-03-12 LAB — POC URINE PREG, ED: Preg Test, Ur: NEGATIVE

## 2021-03-12 MED ORDER — ONDANSETRON 4 MG PO TBDP
4.0000 mg | ORAL_TABLET | Freq: Once | ORAL | Status: AC
  Administered 2021-03-12: 4 mg via ORAL
  Filled 2021-03-12: qty 1

## 2021-03-12 MED ORDER — DICYCLOMINE HCL 10 MG PO CAPS: 10.0000 mg | ORAL_CAPSULE | Freq: Three times a day (TID) | ORAL | 0 refills | Status: DC

## 2021-03-12 MED ORDER — ONDANSETRON 4 MG PO TBDP: 4.0000 mg | ORAL_TABLET | Freq: Three times a day (TID) | ORAL | 0 refills | Status: AC | PRN

## 2021-03-12 MED ORDER — FAMOTIDINE 20 MG PO TABS
40.0000 mg | ORAL_TABLET | Freq: Once | ORAL | Status: AC
  Administered 2021-03-12: 40 mg via ORAL
  Filled 2021-03-12: qty 2

## 2021-03-12 MED ORDER — DICYCLOMINE HCL 10 MG PO CAPS
10.0000 mg | ORAL_CAPSULE | Freq: Once | ORAL | Status: AC
  Administered 2021-03-12: 10 mg via ORAL
  Filled 2021-03-12: qty 1

## 2021-03-12 MED ORDER — ONDANSETRON HCL 4 MG/2ML IJ SOLN
4.0000 mg | Freq: Once | INTRAMUSCULAR | Status: AC
  Administered 2021-03-12: 4 mg via INTRAVENOUS
  Filled 2021-03-12: qty 2

## 2021-03-12 MED ORDER — FAMOTIDINE 20 MG PO TABS: 20.0000 mg | ORAL_TABLET | Freq: Two times a day (BID) | ORAL | 1 refills | Status: AC

## 2021-03-12 MED ORDER — ALUM & MAG HYDROXIDE-SIMETH 200-200-20 MG/5ML PO SUSP
30.0000 mL | Freq: Once | ORAL | Status: AC
  Administered 2021-03-12: 30 mL via ORAL
  Filled 2021-03-12: qty 30

## 2021-03-12 MED ORDER — OXYCODONE-ACETAMINOPHEN 5-325 MG PO TABS
1.0000 | ORAL_TABLET | Freq: Once | ORAL | Status: AC
  Administered 2021-03-12: 1 via ORAL
  Filled 2021-03-12: qty 1

## 2021-03-12 MED ORDER — SODIUM CHLORIDE 0.9 % IV BOLUS
1000.0000 mL | Freq: Once | INTRAVENOUS | Status: AC
  Administered 2021-03-12: 1000 mL via INTRAVENOUS

## 2021-03-12 MED ORDER — OXYCODONE-ACETAMINOPHEN 5-325 MG PO TABS: 1.0000 | ORAL_TABLET | Freq: Four times a day (QID) | ORAL | 0 refills | Status: AC | PRN

## 2021-03-12 MED ORDER — LIDOCAINE VISCOUS HCL 2 % MT SOLN
15.0000 mL | Freq: Once | OROMUCOSAL | Status: AC
  Administered 2021-03-12: 15 mL via ORAL
  Filled 2021-03-12: qty 15

## 2021-03-12 NOTE — ED Provider Notes (Signed)
[facility]-EMERGENCY DEPARTMENT  ____________________________________________  Time seen: Approximately 4:18 PM  I have reviewed the triage vital signs and the nursing notes.   HISTORY  Chief Complaint Abdominal Pain   Patient     HPI Stacy Barnes is a 45 y.o. female with a history of GERD, presents to the emergency department with epigastric abdominal pain that has occurred since Saturday.  Patient reports that the pain is constant and is characterized by a dull aching sensation.  Patient states that she has had associated nausea, occasional emesis and diarrhea.  She denies fever and chills.  Patient reports that her nausea has been so severe she has been unable to eat more than a single saltine at that time.  Patient denies chest pain, chest tightness or shortness of breath.  She reports that she is currently having her cycle and denies possibility of pregnancy.  No dysuria, hematuria or flank pain.  Patient takes daily Protonix but no other antiacid medications.   Past Medical History:  Diagnosis Date   Abnormal Pap smear of cervix      Immunizations up to date:  Yes.     Past Medical History:  Diagnosis Date   Abnormal Pap smear of cervix     Patient Active Problem List   Diagnosis Date Noted   Seasonal allergic rhinitis due to pollen 12/04/2020   Gastroesophageal reflux disease with esophagitis without hemorrhage 11/20/2020   Metabolic syndrome 11/20/2020    Past Surgical History:  Procedure Laterality Date   DILATION AND CURETTAGE OF UTERUS  2011   FOOT FRACTURE SURGERY     PILONIDAL CYST EXCISION      Prior to Admission medications   Medication Sig Start Date End Date Taking? Authorizing Provider  dicyclomine (BENTYL) 10 MG capsule Take 1 capsule (10 mg total) by mouth 4 (four) times daily -  before meals and at bedtime for 5 days. 03/12/21 03/17/21 Yes Pia MauWoods, Monique Hefty M, PA-C  famotidine (PEPCID) 20 MG tablet Take 1 tablet (20 mg total) by mouth 2 (two) times  daily for 10 days. 03/12/21 03/22/21 Yes Pia MauWoods, Ellis Mehaffey M, PA-C  ondansetron (ZOFRAN ODT) 4 MG disintegrating tablet Take 1 tablet (4 mg total) by mouth every 8 (eight) hours as needed for up to 5 days. 03/12/21 03/17/21 Yes Pia MauWoods, Atzin Buchta M, PA-C  oxyCODONE-acetaminophen (PERCOCET/ROXICET) 5-325 MG tablet Take 1 tablet by mouth every 6 (six) hours as needed for up to 5 days. 03/12/21 03/17/21 Yes Pia MauWoods, Bennett Ram M, PA-C  azelastine (ASTELIN) 0.1 % nasal spray Place into both nostrils 2 (two) times daily. Use in each nostril as directed    [provider]  azithromycin (ZITHROMAX) 250 MG tablet Start with 2 tablets today, then 1 daily thereafter. 02/13/21   Wallis BambergMani, Mario, PA-C  cetirizine (ZYRTEC) 10 MG tablet Take 10 mg by mouth daily.    [provider]  fluticasone (FLONASE) 50 MCG/ACT nasal spray Place into both nostrils daily.    [provider]  norethindrone-ethinyl estradiol (LOESTRIN FE) 1-20 MG-MCG tablet Take 1 tablet by mouth daily. 12/24/20   Anyanwu, Jethro BastosUgonna A, MD  pantoprazole (PROTONIX) 40 MG tablet Take 1 tablet (40 mg total) by mouth daily. 11/20/20   Corky DownsMasoud, Javed, MD  triamcinolone cream (KENALOG) 0.5 % Apply 1 application topically 3 (three) times a week. 12/25/20   Tereso NewcomerAnyanwu, Ugonna A, MD    Allergies Azithromycin, Biaxin [clarithromycin], Mobic [meloxicam], and Penicillins  Family History  Problem Relation Age of Onset   Breast cancer Maternal Aunt 1950  died at age 52    Social History Social History   Tobacco Use   Smoking status: Never   Smokeless tobacco: Never  Vaping Use   Vaping Use: Never used  Substance Use Topics   Alcohol use: Not Currently   Drug use: Not Currently     Review of Systems  Constitutional: No fever/chills Eyes:  No discharge ENT: No upper respiratory complaints. Respiratory: no cough. No SOB/ use of accessory muscles to breath Gastrointestinal: Patient has epigastric abdominal pain.  Musculoskeletal: Negative for  musculoskeletal pain. Skin: Negative for rash, abrasions, lacerations, ecchymosis.    ____________________________________________   PHYSICAL EXAM:  VITAL SIGNS: ED Triage Vitals [03/12/21 1340]  Enc Vitals Group     BP      Pulse      Resp      Temp      Temp src      SpO2      Weight 208 lb 5.4 oz (94.5 kg)     Height 5\' 7"  (1.702 m)     Head Circumference      Peak Flow      Pain Score 7     Pain Loc      Pain Edu?      Excl. in GC?      Constitutional: Alert and oriented. Well appearing and in no acute distress. Eyes: Conjunctivae are normal. PERRL. EOMI. Head: Atraumatic. ENT: Cardiovascular: Normal rate, regular rhythm. Normal S1 and S2.  Good peripheral circulation. Respiratory: Normal respiratory effort without tachypnea or retractions. Lungs CTAB. Good air entry to the bases with no decreased or absent breath sounds Gastrointestinal: Bowel sounds x 4 quadrants. Soft and nontender to palpation. No guarding or rigidity. No distention. Musculoskeletal: Full range of motion to all extremities. No obvious deformities noted Neurologic:  Normal for age. No gross focal neurologic deficits are appreciated.  Skin:  Skin is warm, dry and intact. No rash noted. Psychiatric: Mood and affect are normal for age. Speech and behavior are normal.   ____________________________________________   LABS (all labs ordered are listed, but only abnormal results are displayed)  Labs Reviewed  CBC - Abnormal; Notable for the following components:      Result Value   WBC 12.4 (*)    All other components within normal limits  URINALYSIS, COMPLETE (UACMP) WITH MICROSCOPIC - Abnormal; Notable for the following components:   Color, Urine YELLOW (*)    APPearance HAZY (*)    Hgb urine dipstick LARGE (*)    Ketones, ur 80 (*)    Leukocytes,Ua SMALL (*)    Bacteria, UA RARE (*)    All other components within normal limits  LIPASE, BLOOD  COMPREHENSIVE METABOLIC PANEL  POC URINE  PREG, ED   ____________________________________________  EKG   ____________________________________________  RADIOLOGY , personally viewed and evaluated these images (plain radiographs) as part of my medical decision making, as well as reviewing the written report by the radiologist.   Geraldo Pitter Abdomen Limited RUQ (LIVER/GB)  Result Date: 03/12/2021 CLINICAL DATA:  Right upper quadrant pain for several days EXAM: ULTRASOUND ABDOMEN LIMITED RIGHT UPPER QUADRANT COMPARISON:  None. FINDINGS: Gallbladder: Gallbladder is well distended. Mild wall thickening to 5 mm is seen although no pericholecystic fluid is seen. Negative sonographic Murphy's sign is elicited. Gallbladder sludge and small stones are noted. Common bile duct: Diameter: 2.7 mm. Liver: No focal lesion identified. Within normal limits in parenchymal echogenicity. Portal vein is patent on color Doppler imaging  with normal direction of blood flow towards the liver. Other: None. IMPRESSION: Cholelithiasis and gallbladder sludge. Wall thickening in the gallbladder is noted with a negative sonographic Murphy's sign. These changes may be related to chronic cholecystitis. Electronically Signed   By: Alcide Clever M.D.   On: 03/12/2021 17:43    ____________________________________________    PROCEDURES  Procedure(s) performed:     Procedures     Medications  alum & mag hydroxide-simeth (MAALOX/MYLANTA) 200-200-20 MG/5ML suspension 30 mL (30 mLs Oral Given 03/12/21 1658)    And  lidocaine (XYLOCAINE) 2 % viscous mouth solution 15 mL (15 mLs Oral Given 03/12/21 1658)  famotidine (PEPCID) tablet 40 mg (40 mg Oral Given 03/12/21 1657)  sodium chloride 0.9 % bolus 1,000 mL (0 mLs Intravenous Stopped 03/12/21 1958)  dicyclomine (BENTYL) capsule 10 mg (10 mg Oral Given 03/12/21 1657)  ondansetron (ZOFRAN) injection 4 mg (4 mg Intravenous Given 03/12/21 1656)  oxyCODONE-acetaminophen (PERCOCET/ROXICET) 5-325 MG per tablet 1  tablet (1 tablet Oral Given 03/12/21 1956)  ondansetron (ZOFRAN-ODT) disintegrating tablet 4 mg (4 mg Oral Given 03/12/21 1956)     ____________________________________________   INITIAL IMPRESSION / ASSESSMENT AND PLAN / ED COURSE  Pertinent labs & imaging results that were available during my care of the patient were reviewed by me and considered in my medical decision making (see chart for details).      Assessment and plan Epigastric abdominal pain Nausea Diarrhea 45 year old female presents to the emergency department with epigastric abdominal pain, nausea, sporadic vomiting and diarrhea since Saturday.  Patient was bradycardic at triage but vital signs otherwise reassuring.  Abdomen was largely nontender to palpation.  Patient did have some mild discomfort in the right upper quadrant to palpation but she had no guarding or rigidity.    Differential diagnosis includes GERD, gastritis, cholecystitis, choledocholithiasis...  Patient's white blood cell count was mildly elevated.  We will obtain right upper quadrant ultrasound and administer GI cocktail with Pepcid and Bentyl along with Zofran and will reassess.  Patient reported that she did have some symptomatic improvement with GI cocktail.  Right upper quadrant abdominal ultrasound showed some gallbladder wall thickening, sludge and gallbladder stones had negative Murphy sign.  Patient has been afebrile at home with only mild elevation in her white blood cell count.  I reached out to general surgeon on-call, Dr. Claudine Mouton.  Very much appreciate time and consult.  Discussed patient's case and Dr. Claudine Mouton recommended outpatient follow-up with anticipation of seeing patient in the office early next week.  Patient was given Percocet in the emergency department for pain control and discharged with a short course of Percocet along with Zofran and Bentyl.  I also recommended the patient take Pepcid in addition to her Protonix as I suspect  that patient's chronic GERD might be contributing to her pain.  Return precautions were given to return with new or worsening symptoms.  All patient questions were answered.   ____________________________________________  FINAL CLINICAL IMPRESSION(S) / ED DIAGNOSES  Final diagnoses:  RUQ abdominal pain      NEW MEDICATIONS STARTED DURING THIS VISIT:  ED Discharge Orders          Ordered    ondansetron (ZOFRAN ODT) 4 MG disintegrating tablet  Every 8 hours PRN        03/12/21 1907    dicyclomine (BENTYL) 10 MG capsule  3 times daily before meals & bedtime        03/12/21 1907    famotidine (PEPCID) 20 MG  tablet  2 times daily        03/12/21 1907    oxyCODONE-acetaminophen (PERCOCET/ROXICET) 5-325 MG tablet  Every 6 hours PRN        03/12/21 1908                This chart was dictated using voice recognition software/Dragon. Despite best efforts to proofread, errors can occur which can change the meaning. Any change was purely unintentional.     Orvil Feil, PA-C 03/12/21 2015    Chesley Noon, MD 03/12/21 [phone]

## 2021-03-12 NOTE — ED Triage Notes (Signed)
Pt comes into the ED via POV c/o abdominal pain and epigastric pain.  Pt states she has been having a chronic issue with abd pain being intermittent but since Saturday she has had significant upper abdominal pain, N/V/D.  Pt states she has GERD and she has been taking her medication.  Pt denies having any endoscopy as of yet.  Pt took an antibiotic about 3 week ago fort a cat scratch and since that antibiotic, the flare up of stomach pain has been getting worse.  Pt currently has even and unlabored respirations.

## 2021-03-12 NOTE — Discharge Instructions (Addendum)
You can take Zofran every 8 hours. Percocet has been prescribed for pain. Take 40 mg of Pepcid in addition to your Protonix.

## 2021-03-12 NOTE — ED Notes (Signed)
Patient is resting comfortably. 

## 2021-03-12 NOTE — ED Notes (Signed)
Patient reports diffuse upper abdominal pain x several days. Patient reports hx of gastritis. Patient reports nausea, lack of appetite, and decreased oral intake x 3 days. Patient reports vomiting as well. Patient reports recent abx use, and reports increase in pain since taking the abx.

## 2021-03-18 ENCOUNTER — Other Ambulatory Visit: Payer: Self-pay

## 2021-03-18 ENCOUNTER — Ambulatory Visit (INDEPENDENT_AMBULATORY_CARE_PROVIDER_SITE_OTHER): Payer: 59 | Admitting: Surgery

## 2021-03-18 ENCOUNTER — Encounter: Payer: Self-pay | Admitting: Surgery

## 2021-03-18 ENCOUNTER — Ambulatory Visit: Payer: Self-pay | Admitting: Surgery

## 2021-03-18 ENCOUNTER — Telehealth: Payer: Self-pay | Admitting: Surgery

## 2021-03-18 VITALS — BP 109/71 | HR 76 | Temp 98.0°F | Ht 67.0 in | Wt 198.6 lb

## 2021-03-18 DIAGNOSIS — K8010 Calculus of gallbladder with chronic cholecystitis without obstruction: Secondary | ICD-10-CM | POA: Diagnosis not present

## 2021-03-18 NOTE — Patient Instructions (Signed)
Our surgery scheduler Pamala Hurry will call you within 24-48 hours to get you scheduled. If you have not heard from her after 48 hours, please call our office. You will not need to get Covid tested before surgery and have the blue sheet available when she calls to write down important information.   If you have any concerns or questions, please feel free to call our office.   Minimally Invasive Cholecystectomy  Minimally invasive cholecystectomy is surgery to remove the gallbladder. The gallbladder is a pear-shaped organ that lies beneath the liver on the right side of the body. The gallbladder stores bile, which is a fluid that helps the body digest fats. Cholecystectomy is often done to treat inflammation of the gallbladder (cholecystitis). This condition is usually caused by a buildup of gallstones (cholelithiasis) in the gallbladder. Gallstones can block the flow of bile, which can resultin inflammation and pain. In severe cases, emergency surgery may be required. This procedure is done though small incisions in the abdomen, instead of one large incision. It is also called laparoscopic surgery. A thin scope with a camera (laparoscope) is inserted through one incision. Then surgical instruments are inserted through the other incisions. In some cases, a minimally invasive surgery may need to be changed to a surgery that is done through a larger incision. This iscalled open surgery. Tell a health care provider about: Any allergies you have. All medicines you are taking, including vitamins, herbs, eye drops, creams, and over-the-counter medicines. Any problems you or family members have had with anesthetic medicines. Any blood disorders you have. Any surgeries you have had. Any medical conditions you have. Whether you are pregnant or may be pregnant. What are the risks? Generally, this is a safe procedure. However, problems may occur, including: Infection. Bleeding. Allergic reactions to  medicines. Damage to nearby structures or organs. A stone remaining in the common bile duct. The common bile duct carries bile from the gallbladder into the small intestine. A bile leak from the cyst duct that is clipped when your gallbladder is removed. What happens before the procedure? Staying hydrated Follow instructions from your health care provider about hydration, which may include: Up to 2 hours before the procedure - you may continue to drink clear liquids, such as water, clear fruit juice, black coffee, and plain tea.  Eating and drinking restrictions Follow instructions from your health care provider about eating and drinking, which may include: 8 hours before the procedure - stop eating heavy meals or foods, such as meat, fried foods, or fatty foods. 6 hours before the procedure - stop eating light meals or foods, such as toast or cereal. 6 hours before the procedure - stop drinking milk or drinks that contain milk. 2 hours before the procedure - stop drinking clear liquids. Medicines Ask your health care provider about: Changing or stopping your regular medicines. This is especially important if you are taking diabetes medicines or blood thinners. Taking medicines such as aspirin and ibuprofen. These medicines can thin your blood. Do not take these medicines unless your health care provider tells you to take them. Taking over-the-counter medicines, vitamins, herbs, and supplements. General instructions Let your health care provider know if you develop a cold or an infection before surgery. Plan to have someone take you home from the hospital or clinic. If you will be going home right after the procedure, plan to have someone with you for 24 hours. Ask your health care provider: How your surgery site will be marked. What steps  What steps will be taken to help prevent infection. These may include: Removing hair at the surgery site. Washing skin with a germ-killing soap. Taking  antibiotic medicine. What happens during the procedure?  An IV will be inserted into one of your veins. You will be given one or both of the following: A medicine to help you relax (sedative). A medicine to make you fall asleep (general anesthetic). A breathing tube will be placed in your mouth. Your surgeon will make several small incisions in your abdomen. The laparoscope will be inserted through one of the small incisions. The camera on the laparoscope will send images to a monitor in the operating room. This lets your surgeon see inside your abdomen. A gas will be pumped into your abdomen. This will expand your abdomen to give the surgeon more room to perform the surgery. Other tools that are needed for the procedure will be inserted through the other incisions. The gallbladder will be removed through one of the incisions. Your common bile duct may be examined. If stones are found in the common bile duct, they may be removed. After your gallbladder has been removed, the incisions will be closed with stitches (sutures), staples, or skin glue. Your incisions may be covered with a bandage (dressing). The procedure may vary among health care providers and hospitals. What happens after the procedure? Your blood pressure, heart rate, breathing rate, and blood oxygen level will be monitored until you leave the hospital or clinic. You will be given medicines as needed to control your pain. If you were given a sedative during the procedure, it can affect you for several hours. Do not drive or operate machinery until your health care provider says that it is safe. Summary Minimally invasive cholecystectomy, also called laparoscopic cholecystectomy, is surgery to remove the gallbladder using small incisions. Tell your health care provider about all the medical conditions you have and all the medicines you are taking for those conditions. Before the procedure, follow instructions about eating or  drinking restrictions and changing or stopping medicines. If you were given a sedative during the procedure, it can affect you for several hours. Do not drive or operate machinery until your health care provider says that it is safe. This information is not intended to replace advice given to you by your health care provider. Make sure you discuss any questions you have with your health care provider. Document Revised: 05/22/2019 Document Reviewed: 05/22/2019 Elsevier Patient Education  2022 [employer]   Gallbladder Eating Plan  If you have a gallbladder condition, you may have trouble digesting fats. Eating a low-fat diet can help reduce your symptoms, and may be helpful before and after having surgery to remove your gallbladder (cholecystectomy). Your health care provider may recommend that you work with a diet and nutrition specialist (dietitian) to help you reduce the amount of fat in your diet. What are tips for following this plan? General guidelines Limit your fat intake to less than 30% of your total daily calories. If you eat around 1,800 calories each day, this is less than 60 grams (g) of fat per day. Fat is an important part of a healthy diet. Eating a low-fat diet can make it hard to maintain a healthy body weight. Ask your dietitian how much fat, calories, and other nutrients you need each day. Eat small, frequent meals throughout the day instead of three large meals. Drink at least 8-10 cups of fluid a day. Drink enough fluid to keep your urine   clear or pale yellow. Limit alcohol intake to no more than 1 drink a day for nonpregnant women and 2 drinks a day for men. One drink equals 12 oz of beer, 5 oz of wine, or 1 oz of hard liquor. Reading food labels  Check Nutrition Facts on food labels for the amount of fat per serving. Choose foods with less than 3 grams of fat per serving. Shopping Choose nonfat and low-fat healthy foods. Look for the words "nonfat," "low fat," or "fat  free." Avoid buying processed or prepackaged foods. Cooking Cook using low-fat methods, such as baking, broiling, grilling, or boiling. Cook with small amounts of healthy fats, such as olive oil, grapeseed oil, canola oil, or sunflower oil. What foods are recommended? All fresh, frozen, or canned fruits and vegetables. Whole grains. Low-fat or non-fat (skim) milk and yogurt. Lean meat, skinless poultry, fish, eggs, and beans. Low-fat protein supplement powders or drinks. Spices and herbs. What foods are not recommended? High-fat foods. These include baked goods, fast food, fatty cuts of meat, ice cream, french toast, sweet rolls, pizza, cheese bread, foods covered with butter, creamy sauces, or cheese. Fried foods. These include french fries, tempura, battered fish, breaded chicken, fried breads, and sweets. Foods with strong odors. Foods that cause bloating and gas. Summary A low-fat diet can be helpful if you have a gallbladder condition, or before and after gallbladder surgery. Limit your fat intake to less than 30% of your total daily calories. This is about 60 g of fat if you eat 1,800 calories each day. Eat small, frequent meals throughout the day instead of three large meals. This information is not intended to replace advice given to you by your health care provider. Make sure you discuss any questions you have with your health care provider. Document Revised: 04/04/2020 Document Reviewed: 04/04/2020 Elsevier Patient Education  2022 Else[employer] Inc.   

## 2021-03-18 NOTE — Telephone Encounter (Signed)
Patient has been advised of Pre-Admission date/time, COVID Testing date and Surgery date.  Surgery Date: 03/28/21 Preadmission Testing Date: 03/21/21 (phone 8a-1p) Covid Testing Date: Not needed.     Patient has been made aware to call [phone], between 1-3:00pm the day before surgery, to find out what time to arrive for surgery.

## 2021-03-18 NOTE — H&P (View-Only) (Signed)
Patient ID: Stacy Barnes, female   DOB: November 30, 1975, 45 y.o.   MRN: [id number]  Chief Complaint: Epigastric pain.  History of Present Illness Stacy Barnes is a 45 y.o. female with a progressive history of postprandial epigastric pain.  Presented to the ED after her most severe attack.  Pain is radiating to the right and to the back in a bandlike manner.  She denies fevers and chills.  She reports nausea and nonbilious emesis.  She has been avoiding eating, not quite aware of what things to avoid.  She reports her bowel movements have been soft/liquid.  She reports some abdominal cramping, gas and bloating.  Denies any history of jaundice or hepatitis, she also denies any acholic stools.  Past Medical History Past Medical History:  Diagnosis Date   Abnormal Pap smear of cervix       Past Surgical History:  Procedure Laterality Date   DILATION AND CURETTAGE OF UTERUS  2011   FOOT FRACTURE SURGERY     PILONIDAL CYST EXCISION      Allergies  Allergen Reactions   Azithromycin    Biaxin [Clarithromycin] Nausea And Vomiting   Mobic [Meloxicam]    Penicillins Rash    Current Outpatient Medications  Medication Sig Dispense Refill   azelastine (ASTELIN) 0.1 % nasal spray Place into both nostrils 2 (two) times daily. Use in each nostril as directed     famotidine (PEPCID) 20 MG tablet Take 1 tablet (20 mg total) by mouth 2 (two) times daily for 10 days. 30 tablet 1   fluticasone (FLONASE) 50 MCG/ACT nasal spray Place into both nostrils daily.     norethindrone-ethinyl estradiol (LOESTRIN FE) 1-20 MG-MCG tablet Take 1 tablet by mouth daily. 84 tablet 5   pantoprazole (PROTONIX) 40 MG tablet Take 1 tablet (40 mg total) by mouth daily. 60 tablet 4   triamcinolone cream (KENALOG) 0.5 % Apply 1 application topically 3 (three) times a week. 60 g 5   No current facility-administered medications for this visit.    Family History Family History  Problem Relation Age of Onset   Breast  cancer Maternal Aunt 68       died at age 24      Social History Social History   Tobacco Use   Smoking status: Never   Smokeless tobacco: Never  Vaping Use   Vaping Use: Never used  Substance Use Topics   Alcohol use: Not Currently   Drug use: Not Currently        Review of Systems  Constitutional:  Positive for malaise/fatigue and weight loss.  HENT: Negative.    Eyes: Negative.   Respiratory: Negative.    Cardiovascular: Negative.   Gastrointestinal:  Positive for abdominal pain, diarrhea, heartburn, nausea and vomiting.  Genitourinary: Negative.   Skin: Negative.   Neurological: Negative.   Psychiatric/Behavioral: Negative.       Physical Exam Blood pressure 109/71, pulse 76, temperature 98 F (36.7 C), temperature source Oral, height 5\' 7"  (1.702 m), weight 198 lb 9.6 oz (90.1 kg), last menstrual period 03/11/2021, SpO2 98 %. Last Weight  Most recent update: 03/18/2021 11:30 AM    Weight  90.1 kg (198 lb 9.6 oz)             CONSTITUTIONAL: Well developed, and nourished, appropriately responsive and aware without distress.   EYES: Sclera non-icteric.   EARS, NOSE, MOUTH AND THROAT: Mask worn.   The oropharynx is clear. Oral mucosa is pink and moist.    Hearing  is intact to voice.  NECK: Trachea is midline, and there is no jugular venous distension.  LYMPH NODES:  Lymph nodes in the neck are not enlarged. RESPIRATORY:  Lungs are clear, and breath sounds are equal bilaterally. Normal respiratory effort without pathologic use of accessory muscles. CARDIOVASCULAR: Heart is regular in rate and rhythm. GI: The abdomen is soft, nontender, and nondistended. There were no palpable masses. I did not appreciate hepatosplenomegaly. There were normal bowel sounds. MUSCULOSKELETAL:  Symmetrical muscle tone appreciated in all four extremities.    SKIN: Skin turgor is normal. No pathologic skin lesions appreciated.  NEUROLOGIC:  Motor and sensation appear grossly normal.   Cranial nerves are grossly without defect. PSYCH:  Alert and oriented to person, place and time. Affect is appropriate for situation.  Data Reviewed I have personally reviewed what is currently available of the patient's imaging, recent labs and medical records.   Labs:  CBC Latest Ref Rng & Units 03/12/2021 11/28/2020  WBC 4.0 - 10.5 K/uL 12.4(H) 7.6  Hemoglobin 12.0 - 15.0 g/dL 37.1 69.6  Hematocrit 78.9 - 46.0 % 39.5 37.6  Platelets 150 - 400 K/uL 314 276   CMP Latest Ref Rng & Units 03/12/2021 11/28/2020  Glucose 70 - 99 mg/dL 93 82  BUN 6 - 20 mg/dL 8 8  Creatinine 3.81 - 1.00 mg/dL 0.17 5.10  Sodium 258 - 145 mmol/L 138 138  Potassium 3.5 - 5.1 mmol/L 3.8 4.0  Chloride 98 - 111 mmol/L 103 105  CO2 22 - 32 mmol/L 22 22  Calcium 8.9 - 10.3 mg/dL 9.2 9.3  Total Protein 6.5 - 8.1 g/dL 7.9 6.4  Total Bilirubin 0.3 - 1.2 mg/dL 0.8 0.5  Alkaline Phos 38 - 126 U/L 86 -  AST 15 - 41 U/L 16 10  ALT 0 - 44 U/L 18 9      Imaging: Radiology review:   EXAM: ULTRASOUND ABDOMEN LIMITED RIGHT UPPER QUADRANT   COMPARISON:  None.   FINDINGS: Gallbladder:   Gallbladder is well distended. Mild wall thickening to 5 mm is seen although no pericholecystic fluid is seen. Negative sonographic Murphy's sign is elicited. Gallbladder sludge and small stones are noted.   Common bile duct:   Diameter: 2.7 mm.   Liver:   No focal lesion identified. Within normal limits in parenchymal echogenicity. Portal vein is patent on color Doppler imaging with normal direction of blood flow towards the liver.   Other: None.   IMPRESSION: Cholelithiasis and gallbladder sludge. Wall thickening in the gallbladder is noted with a negative sonographic Murphy's sign. These changes may be related to chronic cholecystitis.     Electronically Signed   By: Alcide Clever M.D.   On: 03/12/2021 17:43  Within last 24 hrs: No results found.  Assessment    Chronic calculus cholecystitis with biliary  colic  Patient Active Problem List   Diagnosis Date Noted   Seasonal allergic rhinitis due to pollen 12/04/2020   Gastroesophageal reflux disease with esophagitis without hemorrhage 11/20/2020   Metabolic syndrome 11/20/2020    Plan    Robotic cholecystectomy.  This was discussed thoroughly.  Optimal plan is for robotic cholecystectomy.  Risks and benefits have been discussed with the patient which include but are not limited to anesthesia, bleeding, infection, biliary ductal injury or stenosis, other associated unanticipated injuries affiliated with laparoscopic surgery.  I believe there is the desire to proceed, interpreter utilized as needed.  Questions elicited and answered to satisfaction.  No guarantees ever expressed or  implied.   Face-to-face time spent with the patient and accompanying care providers(if present) was 30 minutes, with more than 50% of the time spent counseling, educating, and coordinating care of the patient.    These notes generated with voice recognition software. I apologize for typographical errors.  Campbell Lerner M.D., FACS 03/18/2021, 11:51 AM

## 2021-03-18 NOTE — Progress Notes (Signed)
Patient ID: Stacy Barnes, female   DOB: November 30, 1975, 45 y.o.   MRN: [id number]  Chief Complaint: Epigastric pain.  History of Present Illness Stacy Barnes is a 45 y.o. female with a progressive history of postprandial epigastric pain.  Presented to the ED after her most severe attack.  Pain is radiating to the right and to the back in a bandlike manner.  She denies fevers and chills.  She reports nausea and nonbilious emesis.  She has been avoiding eating, not quite aware of what things to avoid.  She reports her bowel movements have been soft/liquid.  She reports some abdominal cramping, gas and bloating.  Denies any history of jaundice or hepatitis, she also denies any acholic stools.  Past Medical History Past Medical History:  Diagnosis Date   Abnormal Pap smear of cervix       Past Surgical History:  Procedure Laterality Date   DILATION AND CURETTAGE OF UTERUS  2011   FOOT FRACTURE SURGERY     PILONIDAL CYST EXCISION      Allergies  Allergen Reactions   Azithromycin    Biaxin [Clarithromycin] Nausea And Vomiting   Mobic [Meloxicam]    Penicillins Rash    Current Outpatient Medications  Medication Sig Dispense Refill   azelastine (ASTELIN) 0.1 % nasal spray Place into both nostrils 2 (two) times daily. Use in each nostril as directed     famotidine (PEPCID) 20 MG tablet Take 1 tablet (20 mg total) by mouth 2 (two) times daily for 10 days. 30 tablet 1   fluticasone (FLONASE) 50 MCG/ACT nasal spray Place into both nostrils daily.     norethindrone-ethinyl estradiol (LOESTRIN FE) 1-20 MG-MCG tablet Take 1 tablet by mouth daily. 84 tablet 5   pantoprazole (PROTONIX) 40 MG tablet Take 1 tablet (40 mg total) by mouth daily. 60 tablet 4   triamcinolone cream (KENALOG) 0.5 % Apply 1 application topically 3 (three) times a week. 60 g 5   No current facility-administered medications for this visit.    Family History Family History  Problem Relation Age of Onset   Breast  cancer Maternal Aunt 68       died at age 24      Social History Social History   Tobacco Use   Smoking status: Never   Smokeless tobacco: Never  Vaping Use   Vaping Use: Never used  Substance Use Topics   Alcohol use: Not Currently   Drug use: Not Currently        Review of Systems  Constitutional:  Positive for malaise/fatigue and weight loss.  HENT: Negative.    Eyes: Negative.   Respiratory: Negative.    Cardiovascular: Negative.   Gastrointestinal:  Positive for abdominal pain, diarrhea, heartburn, nausea and vomiting.  Genitourinary: Negative.   Skin: Negative.   Neurological: Negative.   Psychiatric/Behavioral: Negative.       Physical Exam Blood pressure 109/71, pulse 76, temperature 98 F (36.7 C), temperature source Oral, height 5\' 7"  (1.702 m), weight 198 lb 9.6 oz (90.1 kg), last menstrual period 03/11/2021, SpO2 98 %. Last Weight  Most recent update: 03/18/2021 11:30 AM    Weight  90.1 kg (198 lb 9.6 oz)             CONSTITUTIONAL: Well developed, and nourished, appropriately responsive and aware without distress.   EYES: Sclera non-icteric.   EARS, NOSE, MOUTH AND THROAT: Mask worn.   The oropharynx is clear. Oral mucosa is pink and moist.    Hearing  is intact to voice.  NECK: Trachea is midline, and there is no jugular venous distension.  LYMPH NODES:  Lymph nodes in the neck are not enlarged. RESPIRATORY:  Lungs are clear, and breath sounds are equal bilaterally. Normal respiratory effort without pathologic use of accessory muscles. CARDIOVASCULAR: Heart is regular in rate and rhythm. GI: The abdomen is soft, nontender, and nondistended. There were no palpable masses. I did not appreciate hepatosplenomegaly. There were normal bowel sounds. MUSCULOSKELETAL:  Symmetrical muscle tone appreciated in all four extremities.    SKIN: Skin turgor is normal. No pathologic skin lesions appreciated.  NEUROLOGIC:  Motor and sensation appear grossly normal.   Cranial nerves are grossly without defect. PSYCH:  Alert and oriented to person, place and time. Affect is appropriate for situation.  Data Reviewed I have personally reviewed what is currently available of the patient's imaging, recent labs and medical records.   Labs:  CBC Latest Ref Rng & Units 03/12/2021 11/28/2020  WBC 4.0 - 10.5 K/uL 12.4(H) 7.6  Hemoglobin 12.0 - 15.0 g/dL 37.1 69.6  Hematocrit 78.9 - 46.0 % 39.5 37.6  Platelets 150 - 400 K/uL 314 276   CMP Latest Ref Rng & Units 03/12/2021 11/28/2020  Glucose 70 - 99 mg/dL 93 82  BUN 6 - 20 mg/dL 8 8  Creatinine 3.81 - 1.00 mg/dL 0.17 5.10  Sodium 258 - 145 mmol/L 138 138  Potassium 3.5 - 5.1 mmol/L 3.8 4.0  Chloride 98 - 111 mmol/L 103 105  CO2 22 - 32 mmol/L 22 22  Calcium 8.9 - 10.3 mg/dL 9.2 9.3  Total Protein 6.5 - 8.1 g/dL 7.9 6.4  Total Bilirubin 0.3 - 1.2 mg/dL 0.8 0.5  Alkaline Phos 38 - 126 U/L 86 -  AST 15 - 41 U/L 16 10  ALT 0 - 44 U/L 18 9      Imaging: Radiology review:   EXAM: ULTRASOUND ABDOMEN LIMITED RIGHT UPPER QUADRANT   COMPARISON:  None.   FINDINGS: Gallbladder:   Gallbladder is well distended. Mild wall thickening to 5 mm is seen although no pericholecystic fluid is seen. Negative sonographic Murphy's sign is elicited. Gallbladder sludge and small stones are noted.   Common bile duct:   Diameter: 2.7 mm.   Liver:   No focal lesion identified. Within normal limits in parenchymal echogenicity. Portal vein is patent on color Doppler imaging with normal direction of blood flow towards the liver.   Other: None.   IMPRESSION: Cholelithiasis and gallbladder sludge. Wall thickening in the gallbladder is noted with a negative sonographic Murphy's sign. These changes may be related to chronic cholecystitis.     Electronically Signed   By: Alcide Clever M.D.   On: 03/12/2021 17:43  Within last 24 hrs: No results found.  Assessment    Chronic calculus cholecystitis with biliary  colic  Patient Active Problem List   Diagnosis Date Noted   Seasonal allergic rhinitis due to pollen 12/04/2020   Gastroesophageal reflux disease with esophagitis without hemorrhage 11/20/2020   Metabolic syndrome 11/20/2020    Plan    Robotic cholecystectomy.  This was discussed thoroughly.  Optimal plan is for robotic cholecystectomy.  Risks and benefits have been discussed with the patient which include but are not limited to anesthesia, bleeding, infection, biliary ductal injury or stenosis, other associated unanticipated injuries affiliated with laparoscopic surgery.  I believe there is the desire to proceed, interpreter utilized as needed.  Questions elicited and answered to satisfaction.  No guarantees ever expressed or  implied.   Face-to-face time spent with the patient and accompanying care providers(if present) was 30 minutes, with more than 50% of the time spent counseling, educating, and coordinating care of the patient.    These notes generated with voice recognition software. I apologize for typographical errors.  Campbell Lerner M.D., FACS 03/18/2021, 11:51 AM

## 2021-03-21 ENCOUNTER — Encounter
Admission: RE | Admit: 2021-03-21 | Discharge: 2021-03-21 | Disposition: A | Discharge status: Home / Self Care | Payer: 59 | Source: Ambulatory Visit | Attending: Surgery | Admitting: Surgery

## 2021-03-21 ENCOUNTER — Other Ambulatory Visit: Payer: Self-pay | Admitting: Surgery

## 2021-03-21 ENCOUNTER — Other Ambulatory Visit: Payer: Self-pay

## 2021-03-21 ENCOUNTER — Telehealth: Payer: Self-pay | Admitting: Surgery

## 2021-03-21 HISTORY — DX: Other specified postprocedural states: R11.2

## 2021-03-21 HISTORY — DX: Gastro-esophageal reflux disease without esophagitis: K21.9

## 2021-03-21 HISTORY — DX: Other specified postprocedural states: Z98.890

## 2021-03-21 HISTORY — DX: Anemia, unspecified: D64.9

## 2021-03-21 MED ORDER — DICYCLOMINE HCL 10 MG PO CAPS: 10.0000 mg | ORAL_CAPSULE | Freq: Three times a day (TID) | ORAL | 0 refills | Status: DC

## 2021-03-21 NOTE — Patient Instructions (Addendum)
Your procedure is scheduled on:03-28-21 Friday Report to the Registration Desk on the 1st floor of the Medical Mall-Then proceed to the 2nd floor Surgery Desk in the Medical Mall To find out your arrival time, please call [phone] between 1PM - 3PM on:03-27-21 Thursday  REMEMBER: Instructions that are not followed completely may result in serious medical risk, up to and including death; or upon the discretion of your surgeon and anesthesiologist your surgery may need to be rescheduled.  Do not eat food after midnight the night before surgery.  No gum chewing, lozengers or hard candies.  You may however, drink CLEAR liquids up to 2 hours before you are scheduled to arrive for your surgery. Do not drink anything within 2 hours of your scheduled arrival time.  Clear liquids include: - water  - apple juice without pulp - gatorade (not RED, PURPLE, OR BLUE) - black coffee or tea (Do NOT add milk or creamers to the coffee or tea) Do NOT drink anything that is not on this list.  TAKE THESE MEDICATIONS THE MORNING OF SURGERY WITH A SIP OF WATER: -Pepcid (Famotidine) -Protonix (Pantoprazole) -Xyzal (Levocetirizine) -You may take Percocet for pain the morning of surgery if needed  One week prior to surgery: Stop Anti-inflammatories (NSAIDS) such as Advil, Aleve, Ibuprofen, Motrin, Naproxen, Naprosyn and Aspirin based products such as Excedrin, Goodys Powder, BC Powder.You may however, continue to take Percocet/Tylenol if needed for pain up until the day of surgery.  Stop ANY OVER THE COUNTER supplements/vitamins NOW (03-21-21) until after surgery (Fiber Select Gummies and Probiotic)  No Alcohol for 24 hours before or after surgery.  No Smoking including e-cigarettes for 24 hours prior to surgery.  No chewable tobacco products for at least 6 hours prior to surgery.  No nicotine patches on the day of surgery.  Do not use any "recreational" drugs for at least a week prior to your surgery.   Please be advised that the combination of cocaine and anesthesia may have negative outcomes, up to and including death. If you test positive for cocaine, your surgery will be cancelled.  On the morning of surgery brush your teeth with toothpaste and water, you may rinse your mouth with mouthwash if you wish. Do not swallow any toothpaste or mouthwash.  Do not wear jewelry, make-up, hairpins, clips or nail polish.  Do not wear lotions, powders, or perfumes.   Do not shave body from the neck down 48 hours prior to surgery just in case you cut yourself which could leave a site for infection.  Also, freshly shaved skin may become irritated if using the CHG soap.  Contact lenses, hearing aids and dentures may not be worn into surgery.  Do not bring valuables to the hospital. [facility] is not responsible for any missing/lost belongings or valuables.   Use CHG Soap as directed on instruction sheet.   Notify your doctor if there is any change in your medical condition (cold, fever, infection).  Wear comfortable clothing (specific to your surgery type) to the hospital.  After surgery, you can help prevent lung complications by doing breathing exercises.  Take deep breaths and cough every 1-2 hours. Your doctor may order a device called an Incentive Spirometer to help you take deep breaths. When coughing or sneezing, hold a pillow firmly against your incision with both hands. This is called "splinting." Doing this helps protect your incision. It also decreases belly discomfort.  If you are being admitted to the hospital overnight, leave your  suitcase in the car. After surgery it may be brought to your room.  If you are being discharged the day of surgery, you will not be allowed to drive home. You will need a responsible adult (18 years or older) to drive you home and stay with you that night.   If you are taking public transportation, you will need to have a responsible adult (18 years or  older) with you. Please confirm with your physician that it is acceptable to use public transportation.   Please call the Pre-admissions Testing Dept. at [phone] if you have any questions about these instructions.  Surgery Visitation Policy:  Patients undergoing a surgery or procedure may have one family member or support person with them as long as that person is not COVID-19 positive or experiencing its symptoms.  That person may remain in the waiting area during the procedure.  Inpatient Visitation:    Visiting hours are 7 a.m. to 8 p.m. Inpatients will be allowed two visitors daily. The visitors may change each day during the patient's stay. No visitors under the age of 57. Any visitor under the age of 37 must be accompanied by an adult. The visitor must pass COVID-19 screenings, use hand sanitizer when entering and exiting the patient's room and wear a mask at all times, including in the patient's room. Patients must also wear a mask when staff or their visitor are in the room. Masking is required regardless of vaccination status.

## 2021-03-21 NOTE — Telephone Encounter (Signed)
Patient is calling and is complaining of having pain in her stomach, patient is asking if she could possibly get a refill on her Bentyl, so she could get through the week till she has her surgery patient is using [facility] in [place] on [address] . Please call patient and advise.

## 2021-03-26 ENCOUNTER — Telehealth: Payer: Self-pay | Admitting: Surgery

## 2021-03-26 NOTE — Telephone Encounter (Signed)
Spoke with the patient and instructed her to take Miralax three times a day with a full glass of water or liquid until she has a bowel movement then she may reduct to 1-2 times a day. She may also continue this after surgery to keep her bowels moving. She is to call us if this is not effective.

## 2021-03-26 NOTE — Telephone Encounter (Signed)
Incoming call from patient.  She is scheduled for chole with Dr. Claudine Mouton 03/28/21.  She states has not had a bowel movement in a week and is concerned that may cause some problems with her surgery.  She has been trying to do extra fiber and push fluids, but this is not helping.  She also has not had any Bentyl or Percocet in three days as she thought this may be causing her not to have a bowel movement. She is wanting to know if this will be ok and what else she may do to help her have a bowel movement.  She wants to get cleaned out really good before surgery.  Please call her.  Thank you.

## 2021-03-28 ENCOUNTER — Other Ambulatory Visit: Payer: Self-pay

## 2021-03-28 ENCOUNTER — Ambulatory Visit: Payer: 59 | Admitting: Urgent Care

## 2021-03-28 ENCOUNTER — Encounter
Admission: RE | Disposition: A | Discharge status: Home / Self Care | Payer: Self-pay | Source: Home / Self Care | Attending: Surgery

## 2021-03-28 ENCOUNTER — Ambulatory Visit
Admission: RE | Admit: 2021-03-28 | Discharge: 2021-03-28 | Disposition: A | Discharge status: Home / Self Care | Payer: 59 | Attending: Surgery | Admitting: Surgery

## 2021-03-28 ENCOUNTER — Encounter: Payer: Self-pay | Admitting: Surgery

## 2021-03-28 DIAGNOSIS — K8012 Calculus of gallbladder with acute and chronic cholecystitis without obstruction: Secondary | ICD-10-CM | POA: Diagnosis not present

## 2021-03-28 DIAGNOSIS — Z880 Allergy status to penicillin: Secondary | ICD-10-CM | POA: Diagnosis not present

## 2021-03-28 DIAGNOSIS — Z793 Long term (current) use of hormonal contraceptives: Secondary | ICD-10-CM | POA: Diagnosis not present

## 2021-03-28 DIAGNOSIS — K8010 Calculus of gallbladder with chronic cholecystitis without obstruction: Secondary | ICD-10-CM | POA: Diagnosis not present

## 2021-03-28 DIAGNOSIS — Z881 Allergy status to other antibiotic agents status: Secondary | ICD-10-CM | POA: Diagnosis not present

## 2021-03-28 DIAGNOSIS — Z888 Allergy status to other drugs, medicaments and biological substances status: Secondary | ICD-10-CM | POA: Insufficient documentation

## 2021-03-28 DIAGNOSIS — Z79899 Other long term (current) drug therapy: Secondary | ICD-10-CM | POA: Insufficient documentation

## 2021-03-28 DIAGNOSIS — K8070 Calculus of gallbladder and bile duct without cholecystitis without obstruction: Secondary | ICD-10-CM | POA: Diagnosis present

## 2021-03-28 LAB — POCT PREGNANCY, URINE: Preg Test, Ur: NEGATIVE

## 2021-03-28 SURGERY — CHOLECYSTECTOMY, ROBOT-ASSISTED, LAPAROSCOPIC
Anesthesia: General

## 2021-03-28 MED ORDER — OXYCODONE HCL 5 MG PO TABS
5.0000 mg | ORAL_TABLET | Freq: Once | ORAL | Status: AC
  Administered 2021-03-28: 5 mg via ORAL

## 2021-03-28 MED ORDER — KETOROLAC TROMETHAMINE 30 MG/ML IJ SOLN
INTRAMUSCULAR | Status: DC | PRN
  Administered 2021-03-28: 30 mg via INTRAVENOUS

## 2021-03-28 MED ORDER — BUPIVACAINE-EPINEPHRINE (PF) 0.25% -1:200000 IJ SOLN
INTRAMUSCULAR | Status: AC
  Filled 2021-03-28: qty 30

## 2021-03-28 MED ORDER — INDOCYANINE GREEN 25 MG IV SOLR
2.5000 mg | Freq: Once | INTRAVENOUS | Status: AC
  Administered 2021-03-28: 2.5 mg via INTRAVENOUS
  Filled 2021-03-28: qty 1

## 2021-03-28 MED ORDER — FENTANYL CITRATE (PF) 100 MCG/2ML IJ SOLN
INTRAMUSCULAR | Status: AC
  Filled 2021-03-28: qty 2

## 2021-03-28 MED ORDER — CHLORHEXIDINE GLUCONATE CLOTH 2 % EX PADS
6.0000 | MEDICATED_PAD | Freq: Once | CUTANEOUS | Status: AC
  Administered 2021-03-28: 6 via TOPICAL

## 2021-03-28 MED ORDER — HYDROCODONE-ACETAMINOPHEN 5-325 MG PO TABS: 1.0000 | ORAL_TABLET | Freq: Four times a day (QID) | ORAL | 0 refills | Status: DC | PRN

## 2021-03-28 MED ORDER — CHLORHEXIDINE GLUCONATE 0.12 % MT SOLN: 15.0000 mL | Freq: Once | OROMUCOSAL | Status: AC

## 2021-03-28 MED ORDER — DIPHENHYDRAMINE HCL 50 MG/ML IJ SOLN
INTRAMUSCULAR | Status: DC | PRN
  Administered 2021-03-28: 12.5 mg via INTRAVENOUS

## 2021-03-28 MED ORDER — FENTANYL CITRATE (PF) 100 MCG/2ML IJ SOLN
INTRAMUSCULAR | Status: DC | PRN
  Administered 2021-03-28: 50 ug via INTRAVENOUS
  Administered 2021-03-28: 100 ug via INTRAVENOUS
  Administered 2021-03-28: 50 ug via INTRAVENOUS

## 2021-03-28 MED ORDER — PROPOFOL 10 MG/ML IV BOLUS
INTRAVENOUS | Status: AC
  Filled 2021-03-28: qty 20

## 2021-03-28 MED ORDER — ACETAMINOPHEN 10 MG/ML IV SOLN
INTRAVENOUS | Status: DC | PRN
  Administered 2021-03-28: 1000 mg via INTRAVENOUS

## 2021-03-28 MED ORDER — PROPOFOL 10 MG/ML IV BOLUS
INTRAVENOUS | Status: DC | PRN
Start: 2021-03-28 — End: 2021-03-28
  Administered 2021-03-28: 150 mg via INTRAVENOUS

## 2021-03-28 MED ORDER — LIDOCAINE HCL (CARDIAC) PF 100 MG/5ML IV SOSY
PREFILLED_SYRINGE | INTRAVENOUS | Status: DC | PRN
  Administered 2021-03-28: 50 mg via INTRAVENOUS

## 2021-03-28 MED ORDER — KETOROLAC TROMETHAMINE 30 MG/ML IJ SOLN: INTRAMUSCULAR | Status: DC | PRN

## 2021-03-28 MED ORDER — CHLORHEXIDINE GLUCONATE 0.12 % MT SOLN
OROMUCOSAL | Status: AC
  Administered 2021-03-28: 15 mL via OROMUCOSAL
  Filled 2021-03-28: qty 15

## 2021-03-28 MED ORDER — BUPIVACAINE LIPOSOME 1.3 % IJ SUSP
INTRAMUSCULAR | Status: AC
  Filled 2021-03-28: qty 20

## 2021-03-28 MED ORDER — HYDROCODONE-ACETAMINOPHEN 7.5-325 MG PO TABS
1.0000 | ORAL_TABLET | Freq: Once | ORAL | Status: DC | PRN
Start: 2021-03-28 — End: 2021-03-28
  Filled 2021-03-28: qty 1

## 2021-03-28 MED ORDER — BUPIVACAINE-EPINEPHRINE (PF) 0.25% -1:200000 IJ SOLN
INTRAMUSCULAR | Status: DC | PRN
  Administered 2021-03-28: 30 mL

## 2021-03-28 MED ORDER — FAMOTIDINE 20 MG PO TABS
ORAL_TABLET | ORAL | Status: AC
  Filled 2021-03-28: qty 1

## 2021-03-28 MED ORDER — KETAMINE HCL 10 MG/ML IJ SOLN
INTRAMUSCULAR | Status: DC | PRN
  Administered 2021-03-28: 20 mg via INTRAVENOUS

## 2021-03-28 MED ORDER — ORAL CARE MOUTH RINSE: 15.0000 mL | Freq: Once | OROMUCOSAL | Status: AC

## 2021-03-28 MED ORDER — LACTATED RINGERS IV SOLN: INTRAVENOUS | Status: DC

## 2021-03-28 MED ORDER — CIPROFLOXACIN IN D5W 400 MG/200ML IV SOLN
INTRAVENOUS | Status: AC
  Filled 2021-03-28: qty 200

## 2021-03-28 MED ORDER — FENTANYL CITRATE (PF) 100 MCG/2ML IJ SOLN
INTRAMUSCULAR | Status: AC
  Administered 2021-03-28: 25 ug via INTRAVENOUS
  Filled 2021-03-28: qty 2

## 2021-03-28 MED ORDER — SUGAMMADEX SODIUM 200 MG/2ML IV SOLN
INTRAVENOUS | Status: DC | PRN
  Administered 2021-03-28: 200 mg via INTRAVENOUS

## 2021-03-28 MED ORDER — FENTANYL CITRATE (PF) 100 MCG/2ML IJ SOLN
25.0000 ug | INTRAMUSCULAR | Status: DC | PRN
  Administered 2021-03-28: 25 ug via INTRAVENOUS

## 2021-03-28 MED ORDER — DEXMEDETOMIDINE (PRECEDEX) IN NS 20 MCG/5ML (4 MCG/ML) IV SYRINGE
PREFILLED_SYRINGE | INTRAVENOUS | Status: DC | PRN
  Administered 2021-03-28: 10 ug via INTRAVENOUS

## 2021-03-28 MED ORDER — SEVOFLURANE IN SOLN
RESPIRATORY_TRACT | Status: AC
  Filled 2021-03-28: qty 250

## 2021-03-28 MED ORDER — MIDAZOLAM HCL 2 MG/2ML IJ SOLN
INTRAMUSCULAR | Status: DC | PRN
  Administered 2021-03-28: 2 mg via INTRAVENOUS

## 2021-03-28 MED ORDER — CIPROFLOXACIN IN D5W 400 MG/200ML IV SOLN
400.0000 mg | INTRAVENOUS | Status: AC
  Administered 2021-03-28: 400 mg via INTRAVENOUS

## 2021-03-28 MED ORDER — DEXAMETHASONE SODIUM PHOSPHATE 10 MG/ML IJ SOLN
INTRAMUSCULAR | Status: DC | PRN
  Administered 2021-03-28: 10 mg via INTRAVENOUS

## 2021-03-28 MED ORDER — ROCURONIUM BROMIDE 100 MG/10ML IV SOLN
INTRAVENOUS | Status: DC | PRN
Start: 2021-03-28 — End: 2021-03-28
  Administered 2021-03-28: 10 mg via INTRAVENOUS
  Administered 2021-03-28: 40 mg via INTRAVENOUS

## 2021-03-28 MED ORDER — ONDANSETRON HCL 4 MG/2ML IJ SOLN
INTRAMUSCULAR | Status: DC | PRN
  Administered 2021-03-28: 4 mg via INTRAVENOUS

## 2021-03-28 MED ORDER — OXYCODONE HCL 5 MG PO TABS
ORAL_TABLET | ORAL | Status: AC
  Filled 2021-03-28: qty 1

## 2021-03-28 MED ORDER — MIDAZOLAM HCL 2 MG/2ML IJ SOLN
INTRAMUSCULAR | Status: AC
  Filled 2021-03-28: qty 2

## 2021-03-28 MED ORDER — KETAMINE HCL 50 MG/ML IJ SOLN
INTRAMUSCULAR | Status: AC
  Filled 2021-03-28: qty 1

## 2021-03-28 SURGICAL SUPPLY — 49 items
ADH SKN CLS APL DERMABOND .7 (GAUZE/BANDAGES/DRESSINGS) ×1
APL PRP STRL LF DISP 70% ISPRP (MISCELLANEOUS) ×1
BAG SPEC RTRVL LRG 6X4 10 (ENDOMECHANICALS) ×1
CANISTER SUCT 1200ML W/VALVE (MISCELLANEOUS) IMPLANT
CANNULA CAP OBTURATR AIRSEAL 8 (CAP) ×2 IMPLANT
CHLORAPREP W/TINT 26 (MISCELLANEOUS) ×2 IMPLANT
CLIP VESOLOCK LG 6/CT PURPLE (CLIP) ×2 IMPLANT
COVER TIP SHEARS 8 DVNC (MISCELLANEOUS) ×1 IMPLANT
COVER TIP SHEARS 8MM DA VINCI (MISCELLANEOUS) ×1
DECANTER SPIKE VIAL GLASS SM (MISCELLANEOUS) ×2 IMPLANT
DEFOGGER SCOPE WARMER CLEARIFY (MISCELLANEOUS) ×2 IMPLANT
DERMABOND ADVANCED (GAUZE/BANDAGES/DRESSINGS) ×1
DERMABOND ADVANCED .7 DNX12 (GAUZE/BANDAGES/DRESSINGS) ×1 IMPLANT
DRAPE ARM DVNC X/XI (DISPOSABLE) ×4 IMPLANT
DRAPE COLUMN DVNC XI (DISPOSABLE) ×1 IMPLANT
DRAPE DA VINCI XI ARM (DISPOSABLE) ×4
DRAPE DA VINCI XI COLUMN (DISPOSABLE) ×1
ELECT CAUTERY BLADE 6.4 (BLADE) ×2 IMPLANT
GAUZE 4X4 16PLY ~~LOC~~+RFID DBL (SPONGE) ×2 IMPLANT
GLOVE SURG ORTHO LTX SZ7.5 (GLOVE) ×8 IMPLANT
GOWN STRL REUS W/ TWL LRG LVL3 (GOWN DISPOSABLE) ×4 IMPLANT
GOWN STRL REUS W/TWL LRG LVL3 (GOWN DISPOSABLE) ×8
GRASPER SUT TROCAR 14GX15 (MISCELLANEOUS) ×2 IMPLANT
INFUSOR MANOMETER BAG 3000ML (MISCELLANEOUS) IMPLANT
IRRIGATION STRYKERFLOW (MISCELLANEOUS) IMPLANT
IRRIGATOR STRYKERFLOW (MISCELLANEOUS)
IRRIGATOR SUCT 8 DISP DVNC XI (IRRIGATION / IRRIGATOR) IMPLANT
IRRIGATOR SUCTION 8MM XI DISP (IRRIGATION / IRRIGATOR)
IV NS IRRIG 3000ML ARTHROMATIC (IV SOLUTION) IMPLANT
KIT PINK PAD W/HEAD ARE REST (MISCELLANEOUS) ×2
KIT PINK PAD W/HEAD ARM REST (MISCELLANEOUS) ×1 IMPLANT
KIT TURNOVER KIT A (KITS) ×2 IMPLANT
LABEL OR SOLS (LABEL) ×2 IMPLANT
MANIFOLD NEPTUNE II (INSTRUMENTS) ×2 IMPLANT
NEEDLE HYPO 22GX1.5 SAFETY (NEEDLE) ×2 IMPLANT
NEEDLE INSUFFLATION 14GA 120MM (NEEDLE) IMPLANT
NS IRRIG 500ML POUR BTL (IV SOLUTION) ×2 IMPLANT
PACK LAP CHOLECYSTECTOMY (MISCELLANEOUS) ×2 IMPLANT
PENCIL ELECTRO HAND CTR (MISCELLANEOUS) ×2 IMPLANT
POUCH SPECIMEN RETRIEVAL 10MM (ENDOMECHANICALS) ×2 IMPLANT
SEAL CANN UNIV 5-8 DVNC XI (MISCELLANEOUS) ×3 IMPLANT
SEAL XI 5MM-8MM UNIVERSAL (MISCELLANEOUS) ×3
SET TUBE FILTERED XL AIRSEAL (SET/KITS/TRAYS/PACK) ×2 IMPLANT
SOLUTION ELECTROLUBE (MISCELLANEOUS) ×2 IMPLANT
SUT MNCRL 4-0 (SUTURE) ×2
SUT MNCRL 4-0 27XMFL (SUTURE) ×1
SUT VICRYL 0 AB UR-6 (SUTURE) ×2 IMPLANT
SUTURE MNCRL 4-0 27XMF (SUTURE) ×1 IMPLANT
TROCAR Z-THREAD FIOS 11X100 BL (TROCAR) IMPLANT

## 2021-03-28 NOTE — Interval H&P Note (Signed)
History and Physical Interval Note:  03/28/2021 7:21 AM  Stacy Barnes  has presented today for surgery, with the diagnosis of chronic calculous cholecystitis.  The various methods of treatment have been discussed with the patient and family. After consideration of risks, benefits and other options for treatment, the patient has consented to  Procedure(s): XI ROBOTIC ASSISTED LAPAROSCOPIC CHOLECYSTECTOMY (N/A) INDOCYANINE GREEN FLUORESCENCE IMAGING (ICG) (N/A) as a surgical intervention.  The patient's history has been reviewed, patient examined, no change in status, stable for surgery.  I have reviewed the patient's chart and labs.  Questions were answered to the patient's satisfaction.     Campbell Lerner

## 2021-03-28 NOTE — Discharge Instructions (Signed)

## 2021-03-28 NOTE — Anesthesia Procedure Notes (Signed)
Procedure Name: Intubation Date/Time: 03/28/2021 7:30 AM Performed by: Cheral Bay, CRNA Pre-anesthesia Checklist: Patient identified, Emergency Drugs available, Suction available and Patient being monitored Patient Re-evaluated:Patient Re-evaluated prior to induction Oxygen Delivery Method: Circle system utilized Preoxygenation: Pre-oxygenation with 100% oxygen Induction Type: IV induction Ventilation: Mask ventilation without difficulty Laryngoscope Size: McGraph and 3 Grade View: Grade I Tube type: Oral Tube size: 7.0 mm Number of attempts: 1 Airway Equipment and Method: Stylet Placement Confirmation: ETT inserted through vocal cords under direct vision, positive ETCO2 and breath sounds checked- equal and bilateral Secured at: 21 cm Tube secured with: Tape Dental Injury: Teeth and Oropharynx as per pre-operative assessment

## 2021-03-28 NOTE — Op Note (Signed)
Robotic cholecystectomy with Indocyamine Green Ductal Imaging.   Pre-operative Diagnosis: Chronic calculus cholecystitis  Post-operative Diagnosis: Acute calculus cholecystitis with hydrops.  Procedure: Robotic assisted laparoscopic cholecystectomy with Indocyamine Green Ductal Imaging.   Surgeon: Campbell Lerner, M.D., FACS  Anesthesia: General. with endotracheal tube  Findings: Distended and edematous gallbladder with clear bile present.  Estimated Blood Loss: 10 mL         Drains: None         Specimens: Gallbladder           Complications: none  Procedure Details  The patient was seen again in the Holding Room.  2.5 mg dose of ICG was administered intravenously.   The benefits, complications, treatment options, risks and expected outcomes were again reviewed with the patient. The likelihood of improving the patient's symptoms with return to their baseline status is good.  The patient and/or family concurred with the proposed plan, giving informed consent, again alternatives reviewed.  The patient was taken to Operating Room, identified, and the procedure verified as robotic assisted laparoscopic cholecystectomy.  Prior to the induction of general anesthesia, antibiotic prophylaxis was administered. VTE prophylaxis was in place. General endotracheal anesthesia was then administered and tolerated well. The patient was positioned in the supine position.  After the induction, the abdomen was prepped with Chloraprep and draped in the sterile fashion.  A Time Out was held and the above information confirmed.  Right infra-umbilical local infiltration with quarter percent Marcaine with epinephrine is utilized.  Made a 12 mm incision on the right periumbilical site, I advanced an optical 67mm port under direct visualization into the peritoneal cavity.  Once the peritoneum was penetrated, insufflation was initiated.  The trocar was then advanced into the abdominal cavity under direct  visualization. Pneumoperitoneum was then continued with Air seal utilizing CO2 at 15 mmHg or less and tolerated well without any adverse changes in the patient's vital signs.  Two 8.5-mm ports were placed in the left lower quadrant and laterally, and one to the right lower quadrant, all under direct vision. All skin incisions  were infiltrated with a local anesthetic agent before making the incision and placing the trocars.  The patient was positioned  in reverse Trendelenburg, tilted the patient's left side down.  Da Vinci XI robot was then positioned on to the patient's left side, and docked.  The gallbladder was identified, the fundus grasped via the arm 4 Prograsp and retracted cephalad. Adhesions were lysed with scissors and cautery. The infundibulum was identified grasped and retracted laterally, exposing the peritoneum overlying the triangle of Calot. This was then opened and dissected using cautery & scissors. An extended critical view of the cystic duct and cystic artery was obtained, aided by the ICG via FireFly which enabled ready visualization of the ductal anatomy.    The cystic duct was clearly identified and dissected to isolation.   Artery well isolated and clipped, and the cystic duct was triple clipped and divided with scissors, as close to the gallbladder neck as feasible, thus leaving two on the remaining stump.  The specimen side of the artery is sealed with bipolar and divided with monopolar scissors.   The gallbladder was taken from the gallbladder fossa in a retrograde fashion with the electrocautery. The gallbladder was removed and placed in an Endocatch bag.  The liver bed is inspected. Hemostasis was confirmed.  The robot was undocked and moved away from the operative field. Close to 3 L normal saline irrigation was utilized and was  aspirated clear.  The gallbladder and Endocatch sac were then removed through the infraumbilical port site.   Inspection of the right upper  quadrant was performed. No bleeding, bile duct injury or leak, or bowel injury was noted. The infra-umbilical port site fascia was closed with interrumpted 0 Vicryl sutures using PMI/cone under direct visualization. Pneumoperitoneum was released and ports removed.  4-0 subcuticular Monocryl was used to close the skin. Dermabond was  applied.  The patient was then extubated and brought to the recovery room in stable condition. Sponge, lap, and needle counts were correct at closure and at the conclusion of the case.               Campbell Lerner, M.D., [facility] 03/28/2021 9:17 AM

## 2021-03-28 NOTE — Anesthesia Preprocedure Evaluation (Signed)
Anesthesia Evaluation  Patient identified by MRN, date of birth, ID band Patient awake    Reviewed: Allergy & Precautions, NPO status , Patient's Chart, lab work & pertinent test results  History of Anesthesia Complications (+) PONV and history of anesthetic complications  Airway Mallampati: III  TM Distance: >3 FB Neck ROM: full    Dental no notable dental hx.    Pulmonary neg pulmonary ROS,    Pulmonary exam normal        Cardiovascular negative cardio ROS Normal cardiovascular exam     Neuro/Psych negative neurological ROS  negative psych ROS   GI/Hepatic Neg liver ROS, GERD  Medicated,  Endo/Other  negative endocrine ROS  Renal/GU      Musculoskeletal   Abdominal   Peds  Hematology  (+) Blood dyscrasia, anemia ,   Anesthesia Other Findings Past Medical History: No date: Abnormal Pap smear of cervix No date: Anemia     Comment:  h/o as a child No date: GERD (gastroesophageal reflux disease) No date: PONV (postoperative nausea and vomiting)     Comment:  If under anesthesia for a long time pt gets very               nauseated but has never vomited  Past Surgical History: 2011: DILATION AND CURETTAGE OF UTERUS No date: FOOT FRACTURE SURGERY; Right No date: PILONIDAL CYST EXCISION  BMI    Body Mass Index: 30.54 kg/m      Reproductive/Obstetrics negative OB ROS                             Anesthesia Physical Anesthesia Plan  ASA: 2  Anesthesia Plan: General ETT   Post-op Pain Management:    Induction: Intravenous  PONV Risk Score and Plan: Ondansetron, Dexamethasone, Midazolam and Treatment may vary due to age or medical condition  Airway Management Planned: Oral ETT  Additional Equipment:   Intra-op Plan:   Post-operative Plan: Extubation in OR  Informed Consent: I have reviewed the patients History and Physical, chart, labs and discussed the procedure  including the risks, benefits and alternatives for the proposed anesthesia with the patient or authorized representative who has indicated his/her understanding and acceptance.     Dental Advisory Given  Plan Discussed with: Anesthesiologist, CRNA and Surgeon  Anesthesia Plan Comments: (Patient consented for risks of anesthesia including but not limited to:  - adverse reactions to medications - damage to eyes, teeth, lips or other oral mucosa - nerve damage due to positioning  - sore throat or hoarseness - Damage to heart, brain, nerves, lungs, other parts of body or loss of life  Patient voiced understanding.)        Anesthesia Quick Evaluation

## 2021-03-28 NOTE — Transfer of Care (Signed)
Immediate Anesthesia Transfer of Care Note  Patient: Stacy Barnes  Procedure(s) Performed: XI ROBOTIC ASSISTED LAPAROSCOPIC CHOLECYSTECTOMY INDOCYANINE GREEN FLUORESCENCE IMAGING (ICG)  Patient Location: PACU  Anesthesia Type:General  Level of Consciousness: drowsy  Airway & Oxygen Therapy: Patient Spontanous Breathing and Patient connected to face mask oxygen  Post-op Assessment: Report given to RN and Post -op Vital signs reviewed and stable  Post vital signs: Reviewed and stable  Last Vitals:  Vitals Value Taken Time  BP 124/72 03/28/21 0905  Temp    Pulse 73 03/28/21 0907  Resp 18 03/28/21 0907  SpO2 97 % 03/28/21 0907  Vitals shown include unvalidated device data.  Last Pain:  Vitals:   03/28/21 0617  TempSrc: Oral  PainSc: 0-No pain      Patients Stated Pain Goal: 0 (03/28/21 0617)  Complications: No notable events documented.

## 2021-03-28 NOTE — Anesthesia Postprocedure Evaluation (Signed)
Anesthesia Post Note  Patient: Stacy Barnes  Procedure(s) Performed: XI ROBOTIC ASSISTED LAPAROSCOPIC CHOLECYSTECTOMY INDOCYANINE GREEN FLUORESCENCE IMAGING (ICG)  Patient location during evaluation: PACU Anesthesia Type: General Level of consciousness: awake and alert Pain management: pain level controlled Vital Signs Assessment: post-procedure vital signs reviewed and stable Respiratory status: spontaneous breathing, nonlabored ventilation, respiratory function stable and patient connected to nasal cannula oxygen Cardiovascular status: blood pressure returned to baseline and stable Postop Assessment: no apparent nausea or vomiting Anesthetic complications: no   No notable events documented.   Last Vitals:  Vitals:   03/28/21 0945 03/28/21 1000  BP: 132/81 129/81  Pulse: 77 78  Resp: 11 14  Temp:  (!) 36.4 C  SpO2: 99% 97%    Last Pain:  Vitals:   03/28/21 1000  TempSrc:   PainSc: 5                  Johny Blamer

## 2021-03-31 LAB — SURGICAL PATHOLOGY

## 2021-04-02 ENCOUNTER — Ambulatory Visit: Payer: 59 | Admitting: Internal Medicine

## 2021-04-10 ENCOUNTER — Encounter: Payer: Self-pay | Admitting: Surgery

## 2021-04-10 ENCOUNTER — Ambulatory Visit (INDEPENDENT_AMBULATORY_CARE_PROVIDER_SITE_OTHER): Payer: 59 | Admitting: Surgery

## 2021-04-10 ENCOUNTER — Other Ambulatory Visit: Payer: Self-pay

## 2021-04-10 VITALS — BP 119/64 | HR 52 | Temp 98.1°F | Ht 67.0 in | Wt 195.0 lb

## 2021-04-10 DIAGNOSIS — Z9049 Acquired absence of other specified parts of digestive tract: Secondary | ICD-10-CM

## 2021-04-10 NOTE — Progress Notes (Signed)
[facility] POST-OP OFFICE VISIT  04/10/2021  HPI: Stacy Barnes is a 45 y.o. female 2 weeks s/p robotic cholecystectomy.  She has noted some loose stools with variations in her diet.  Denies any issues with eating, feels for a little earlier than usual.  Denies any history of nausea, vomiting, fevers or chills.  Denies any abdominal pain.  Vital signs: BP 119/64   Pulse (!) 52   Temp 98.1 F (36.7 C)   Ht 5\' 7"  (1.702 m)   Wt 195 lb (88.5 kg)   LMP 03/11/2021 (Exact Date)   SpO2 98%   BMI 30.54 kg/m    Physical Exam: Constitutional: She appears well. Abdomen: Soft and benign.    Skin: Incisions are clean, dry and intact.  Assessment/Plan: This is a 45 y.o. female 13 days s/p robotic cholecystectomy.  Patient Active Problem List   Diagnosis Date Noted   CCC (chronic calculous cholecystitis) 03/18/2021   Seasonal allergic rhinitis due to pollen 12/04/2020   Gastroesophageal reflux disease with esophagitis without hemorrhage 11/20/2020   Metabolic syndrome 11/20/2020    -She appears to be doing well, we addressed multiple questions regarding medications like aspirin like Pepcid in addition to her PPI etc.  She has a history of reflux which does not seem as bad as it used to be, I advise she go ahead and follow-up with her gastroenterologist as scheduled for consultation possible EGD for her GERD.  We will be glad to see her back as needed.    11/22/2020 M.D., FACS 04/10/2021, 9:55 AM

## 2021-04-10 NOTE — Patient Instructions (Addendum)
You may advance your diet as tolerated. Follow-up with our office as needed.  Please call and ask to speak with a nurse if you develop questions or concerns.   GENERAL POST-OPERATIVE PATIENT INSTRUCTIONS   WOUND CARE INSTRUCTIONS:  Try to keep the wound dry and avoid ointments on the wound unless directed to do so.  If the wound becomes bright red and painful or starts to drain infected material that is not clear, please contact your physician immediately.  If the wound is mildly pink and has a thick firm ridge underneath it, this is normal, and is referred to as a healing ridge.  This will resolve over the next 4-6 weeks.  BATHING: You may shower if you have been informed of this by your surgeon. However, Please do not submerge in a tub, hot tub, or pool until incisions are completely sealed or have been told by your surgeon that you may do so.  DIET:  You may eat any foods that you can tolerate.  It is a good idea to eat a high fiber diet and take in plenty of fluids to prevent constipation.  If you do become constipated you may want to take a mild laxative or take ducolax tablets on a daily basis until your bowel habits are regular.  Constipation can be very uncomfortable, along with straining, after recent surgery.  ACTIVITY:  You are encouraged to walk and engage in light activity for the next two weeks.  You should not lift more than 20 pounds for 4 weeks after surgery as it could put you at increased risk for complications.  Twenty pounds is roughly equivalent to a plastic bag of groceries. At that time- Listen to your body when lifting, if you have pain when lifting, stop and then try again in a few days. Soreness after doing exercises or activities of daily living is normal as you get back in to your normal routine.  MEDICATIONS:  Try to take narcotic medications and anti-inflammatory medications, such as tylenol, ibuprofen, naprosyn, etc., with food.  This will minimize stomach upset from  the medication.  Should you develop nausea and vomiting from the pain medication, or develop a rash, please discontinue the medication and contact your physician.  You should not drive, make important decisions, or operate machinery when taking narcotic pain medication.  SUNBLOCK Use sun block to incision area over the next year if this area will be exposed to sun. This helps decrease scarring and will allow you avoid a permanent darkened area over your incision.  QUESTIONS:  Please feel free to call our office if you have any questions, and we will be glad to assist you.

## 2021-10-25 ENCOUNTER — Encounter: Payer: Self-pay | Admitting: Radiology

## 2021-12-29 ENCOUNTER — Other Ambulatory Visit: Payer: Self-pay | Admitting: Internal Medicine

## 2021-12-29 ENCOUNTER — Other Ambulatory Visit: Payer: Self-pay | Admitting: Obstetrics & Gynecology

## 2021-12-29 DIAGNOSIS — Z1231 Encounter for screening mammogram for malignant neoplasm of breast: Secondary | ICD-10-CM

## 2022-01-19 ENCOUNTER — Ambulatory Visit
Admission: RE | Admit: 2022-01-19 | Discharge: 2022-01-19 | Disposition: A | Discharge status: Home / Self Care | Payer: No Typology Code available for payment source | Source: Ambulatory Visit | Attending: Internal Medicine | Admitting: Internal Medicine

## 2022-01-19 DIAGNOSIS — Z1231 Encounter for screening mammogram for malignant neoplasm of breast: Secondary | ICD-10-CM | POA: Insufficient documentation

## 2022-01-23 ENCOUNTER — Other Ambulatory Visit: Payer: Self-pay | Admitting: Obstetrics & Gynecology

## 2022-01-23 DIAGNOSIS — Z3041 Encounter for surveillance of contraceptive pills: Secondary | ICD-10-CM

## 2022-09-08 ENCOUNTER — Encounter: Payer: Self-pay | Admitting: Emergency Medicine

## 2022-09-08 ENCOUNTER — Emergency Department
Admission: EM | Admit: 2022-09-08 | Discharge: 2022-09-08 | Disposition: A | Discharge status: Home / Self Care | Payer: No Typology Code available for payment source | Attending: Emergency Medicine | Admitting: Emergency Medicine

## 2022-09-08 DIAGNOSIS — W25XXXA Contact with sharp glass, initial encounter: Secondary | ICD-10-CM | POA: Insufficient documentation

## 2022-09-08 DIAGNOSIS — S61012A Laceration without foreign body of left thumb without damage to nail, initial encounter: Secondary | ICD-10-CM | POA: Diagnosis not present

## 2022-09-08 DIAGNOSIS — Y9389 Activity, other specified: Secondary | ICD-10-CM | POA: Diagnosis not present

## 2022-09-08 DIAGNOSIS — Y92000 Kitchen of unspecified non-institutional (private) residence as  the place of occurrence of the external cause: Secondary | ICD-10-CM | POA: Diagnosis not present

## 2022-09-08 NOTE — ED Provider Notes (Signed)
[facility] Provider Note    Event Date/Time   First MD Initiated Contact with Patient 09/08/22 2308     (approximate)   History   Laceration   HPI  Stacy Barnes is a 47 y.o. female who presents to the ED for evaluation of Laceration   Right-hand-dominant patient presents to the ED after an accidental laceration to the distal tip of the left thumb.  She was using a small serrated knife in the kitchen and accidentally struck herself to the distal tip of that left thumb.  Reports it is now hemostatic, was uncertain if she needed to even come in.   Physical Exam   Triage Vital Signs: ED Triage Vitals [09/08/22 2015]  Enc Vitals Group     BP (!) 144/97     Pulse Rate 83     Resp 18     Temp 98.4 F (36.9 C)     Temp Source Oral     SpO2 100 %     Weight 210 lb (95.3 kg)     Height 5\' 7"  (1.702 m)     Head Circumference      Peak Flow      Pain Score 3     Pain Loc      Pain Edu?      Excl. in [place]?     Most recent vital signs: Vitals:   09/08/22 2015 09/08/22 2343  BP: (!) 144/97   Pulse: 83   Resp: 18 14  Temp: 98.4 F (36.9 C)   SpO2: 100%     General: Awake, no distress.  Pleasant and conversational.  Looks systemically well. CV:  Good peripheral perfusion.  Resp:  Normal effort.  Abd:  No distention.  MSK:  No deformity noted.  Neuro:  No focal deficits appreciated. Other:  Isolated 1 cm laceration transverse over the distal tip of the left thumb, paralleling the fingernail, but does not extend beneath the nail into the nailbed and does not involve the nail itself.  Fairly superficial and just into the dermis.  Hemostatic with direct pressure.   ED Results / Procedures / Treatments   Labs (all labs ordered are listed, but only abnormal results are displayed) Labs Reviewed - No data to display  EKG   RADIOLOGY   Official radiology report(s): No results found.  PROCEDURES and INTERVENTIONS:  .Marland KitchenLaceration  Repair  Date/Time: 09/09/2022 5:03 AM  Performed by: Vladimir Crofts, MD Authorized by: Vladimir Crofts, MD   Consent:    Consent obtained:  Verbal   Consent given by:  Patient   Risks, benefits, and alternatives were discussed: yes   Laceration details:    Location:  Finger   Finger location:  L thumb   Length (cm):  1 Exploration:    Hemostasis achieved with:  Direct pressure Treatment:    Area cleansed with:  Soap and water Skin repair:    Repair method:  Tissue adhesive Approximation:    Approximation:  Close Repair type:    Repair type:  Simple Post-procedure details:    Procedure completion:  Tolerated well, no immediate complications   Medications - No data to display   IMPRESSION / MDM / [place] / ED COURSE  I reviewed the triage vital signs and the nursing notes.  47 year old woman presents with a pretty superficial laceration to her left thumb after accidentally striking herself with a serrated knife while cooking.  We discussed no intervention beyond wound care versus  Dermabond and she would prefer Dermabond placed, which I do after we thoroughly cleaned the area with soap and water.  Discussed wound care and return precautions.      FINAL CLINICAL IMPRESSION(S) / ED DIAGNOSES   Final diagnoses:  Laceration of left thumb without foreign body without damage to nail, initial encounter     Rx / DC Orders   ED Discharge Orders     None        Note:  This document was prepared using Dragon voice recognition software and may include unintentional dictation errors.   Delton [place], MD 09/09/22 [phone]

## 2022-09-08 NOTE — Discharge Instructions (Signed)
Please take Tylenol and ibuprofen/Advil for your pain.  It is safe to take them together, or to alternate them every few hours.  Take up to 1000mg of Tylenol at a time, up to 4 times per day.  Do not take more than 4000 mg of Tylenol in 24 hours.  For ibuprofen, take 400-600 mg, 3 - 4 times per day.  

## 2022-09-08 NOTE — ED Triage Notes (Signed)
Pt presents via POV with complaints of left thumb laceration - near the tip of the finger that occurred tonight with a kitchen knife. Bleeding is controlled with gauze. UTD on tetanus shot. Denies CP or SOB.

## 2022-10-05 ENCOUNTER — Encounter: Payer: Self-pay | Admitting: Nurse Practitioner

## 2022-10-05 ENCOUNTER — Telehealth: Payer: Self-pay | Admitting: Nurse Practitioner

## 2022-10-05 ENCOUNTER — Ambulatory Visit (INDEPENDENT_AMBULATORY_CARE_PROVIDER_SITE_OTHER): Payer: No Typology Code available for payment source | Admitting: Nurse Practitioner

## 2022-10-05 VITALS — BP 114/78 | HR 67 | Temp 97.8°F | Resp 16 | Ht 67.0 in | Wt 214.0 lb

## 2022-10-05 DIAGNOSIS — Z79899 Other long term (current) drug therapy: Secondary | ICD-10-CM | POA: Diagnosis not present

## 2022-10-05 DIAGNOSIS — K219 Gastro-esophageal reflux disease without esophagitis: Secondary | ICD-10-CM | POA: Insufficient documentation

## 2022-10-05 DIAGNOSIS — Z0000 Encounter for general adult medical examination without abnormal findings: Secondary | ICD-10-CM | POA: Diagnosis not present

## 2022-10-05 DIAGNOSIS — J310 Chronic rhinitis: Secondary | ICD-10-CM

## 2022-10-05 DIAGNOSIS — Z1231 Encounter for screening mammogram for malignant neoplasm of breast: Secondary | ICD-10-CM

## 2022-10-05 DIAGNOSIS — J349 Unspecified disorder of nose and nasal sinuses: Secondary | ICD-10-CM

## 2022-10-05 DIAGNOSIS — I499 Cardiac arrhythmia, unspecified: Secondary | ICD-10-CM | POA: Diagnosis not present

## 2022-10-05 DIAGNOSIS — E669 Obesity, unspecified: Secondary | ICD-10-CM | POA: Diagnosis not present

## 2022-10-05 LAB — COMPREHENSIVE METABOLIC PANEL
ALT: 9 U/L (ref 0–35)
AST: 9 U/L (ref 0–37)
Albumin: 4.1 g/dL (ref 3.5–5.2)
Alkaline Phosphatase: 75 U/L (ref 39–117)
BUN: 12 mg/dL (ref 6–23)
CO2: 25 mEq/L (ref 19–32)
Calcium: 9.3 mg/dL (ref 8.4–10.5)
Chloride: 105 mEq/L (ref 96–112)
Creatinine, Ser: 0.86 mg/dL (ref 0.40–1.20)
GFR: 82.06 mL/min (ref >60.00)
Glucose, Bld: 88 mg/dL (ref 70–99)
Potassium: 4.1 mEq/L (ref 3.5–5.1)
Sodium: 139 mEq/L (ref 135–145)
Total Bilirubin: 0.5 mg/dL (ref 0.2–1.2)
Total Protein: 7 g/dL (ref 6.0–8.3)

## 2022-10-05 LAB — LIPID PANEL
Cholesterol: 207 mg/dL — ABNORMAL HIGH (ref 0–200)
HDL: 50.8 mg/dL (ref >39.00)
LDL Cholesterol: 128 mg/dL — ABNORMAL HIGH (ref 0–99)
NonHDL: 155.7
Total CHOL/HDL Ratio: 4
Triglycerides: 137 mg/dL (ref 0.0–149.0)
VLDL: 27.4 mg/dL (ref 0.0–40.0)

## 2022-10-05 LAB — CBC
HCT: 36.7 % (ref 36.0–46.0)
Hemoglobin: 12.1 g/dL (ref 12.0–15.0)
MCHC: 32.9 g/dL (ref 30.0–36.0)
MCV: 84.7 fl (ref 78.0–100.0)
Platelets: 275 10*3/uL (ref 150.0–400.0)
RBC: 4.33 Mil/uL (ref 3.87–5.11)
RDW: 13.4 % (ref 11.5–15.5)
WBC: 7.4 10*3/uL (ref 4.0–10.5)

## 2022-10-05 LAB — HEMOGLOBIN A1C: Hgb A1c MFr Bld: 5.3 % (ref 4.6–6.5)

## 2022-10-05 LAB — TSH: TSH: 2.28 u[IU]/mL (ref 0.35–5.50)

## 2022-10-05 LAB — MAGNESIUM: Magnesium: 2.1 mg/dL (ref 1.5–2.5)

## 2022-10-05 NOTE — Telephone Encounter (Signed)
Ppw is in [employer].

## 2022-10-05 NOTE — Assessment & Plan Note (Signed)
Regular irregular heartbeat auscultated on exam.  EKG showed sinus bradycardia.  Did review signs and symptoms when to let office know.  Stable

## 2022-10-05 NOTE — Assessment & Plan Note (Signed)
Encouraged healthy lifestyle modifications inclusive of exercise 30 minutes a day 5 times a week. 

## 2022-10-05 NOTE — Assessment & Plan Note (Signed)
Followed by allergist.  Patient on Flonase, Astelin, Xyzal.  Continue taking medication as prescribed follow-up with specialist as recommended

## 2022-10-05 NOTE — Patient Instructions (Addendum)
Nice to see you today I will be in touch with the labs once I have them Follow up with me in 1 year for you next physical, sooner if you need me I have referred you to ENT in [place]  EKG was normal in office.

## 2022-10-05 NOTE — Assessment & Plan Note (Signed)
Patient is followed by [facility] gastro[facility]ology.  Dr. Michail Sermon.  Patient is on Protonix 40 mg daily and Pepcid on a as needed basis continue taking medication as prescribed follow-up with GI as recommended

## 2022-10-05 NOTE — Telephone Encounter (Signed)
Pt states she had her cpe comp today, 2/5. Pt states she was told to bring in ppw for [employer] to comp for her insurance to receive an discount. Pt requested ppw to be faxed to [phone] once comp. Call back # [phone]

## 2022-10-05 NOTE — Assessment & Plan Note (Signed)
Discussed age-appropriate immunizations and screening exams.  Patient is up-to-date on tetanus vaccine and flu.  She is too young for CRC screening.  Pap smear recently done in 2021 and followed by GYN.  Patient was given information about preventative health care with anticipatory guidance for age range.

## 2022-10-05 NOTE — Progress Notes (Signed)
New Patient Office Visit  Subjective    Patient ID: Stacy Barnes, female    DOB: 06/28/1976  Age: 47 y.o. MRN: [id number]  CC:  Chief Complaint  Patient presents with   Establish Care    HPI Stacy Barnes presents to establish care   for complete physical and follow up of chronic conditions.   GERD: States that she is followed by [facility] Dr Michail Sermon. Prontinix daily and pepcid as needed. States that she has had an endoscopy before  Alleriges: States that she will do flonase and xyzal. Will use azelastine as needed. Is established with an allergist   OCP: [facility] and [facility]  Headache: twice a month that she can take excedrin for that helps sometime. States steam and netti pots can help. States it is frontal and bilateral. States that it is a dull ache and can be onsided. Lasts 1-5 days in length. Some light sensitivy some sound. No Nausea or vomting.  Immunizations: -Tetanus: Completed in 2021 -Influenza: Completed this season -Shingles: Completed Shingrix series -Pneumonia: Completed in   -HPV: info given   Diet: Fair diet. States that she does 3 meals a day. Coffee and water. Some snacking  Exercise:  Walk 3 times a week. States it is 20-40 mins at at time   Eye exam: . Wears glasses. Been a few years  Dental exam: Completes semi-annually   Pap Smear: Completed in 2021 Mammogram: Completed in 12/2020 at [facility]  Colonoscopy: too young. States she will call [facility] once she turns 3 Lung Cancer Screening: NA Dexa: NA  Sleep:  goes to bed around 11 and get up ar 7. States that she does feel rested most of the time. Does not snore    Outpatient Encounter Medications as of 10/05/2022  Medication Sig   FIBER SELECT GUMMIES PO Take 2 tablets by mouth daily.   fluticasone (FLONASE) 50 MCG/ACT nasal spray Place 1 spray into both nostrils at bedtime.   JUNEL FE 1/20 1-20 MG-MCG tablet TAKE 1 TABLET BY MOUTH EVERY DAY   levocetirizine (XYZAL) 5 MG  tablet Take 5 mg by mouth in the morning.   pantoprazole (PROTONIX) 40 MG tablet Take 1 tablet (40 mg total) by mouth daily. (Patient taking differently: Take 40 mg by mouth every morning.)   Probiotic Product (RA PROBIOTIC GUMMIES PO) Take 2 tablets by mouth daily.   azelastine (ASTELIN) 0.1 % nasal spray Place 1 spray into both nostrils at bedtime. Use in each nostril as directed (Patient not taking: Reported on 10/05/2022)   famotidine (PEPCID) 20 MG tablet Take 1 tablet (20 mg total) by mouth 2 (two) times daily for 10 days.   triamcinolone cream (KENALOG) 0.5 % Apply 1 application topically 3 (three) times a week. (Patient not taking: Reported on 10/05/2022)   No facility-administered encounter medications on file as of 10/05/2022.    Past Medical History:  Diagnosis Date   Abnormal Pap smear of cervix    Anemia    h/o as a child   GERD (gastroesophageal reflux disease)    PONV (postoperative nausea and vomiting)    If under anesthesia for a long time pt gets very nauseated but has never vomited    Past Surgical History:  Procedure Laterality Date   CHOLECYSTECTOMY     DILATION AND CURETTAGE OF UTERUS  2011   FOOT FRACTURE SURGERY Right    PILONIDAL CYST EXCISION      Family History  Problem Relation Age of Onset  Arthritis Mother    Breast cancer Maternal Aunt 68       died at age 51   Cancer - Colon Maternal Grandmother    Diabetes Maternal Grandfather     Social History   Socioeconomic History   Marital status: Married    Spouse name: Sonia Side   Number of children: 1   Years of education: Not on file   Highest education level: Bachelor's degree (e.g., BA, AB, BS)  Occupational History   Not on file  Tobacco Use   Smoking status: Never   Smokeless tobacco: Never  Vaping Use   Vaping Use: Never used  Substance and Sexual Activity   Alcohol use: Yes    Comment: occ   Drug use: Never   Sexual activity: Yes    Birth control/protection: Pill  Other Topics Concern    Not on file  Social History Narrative   Fullitme: At non profit      Hobbies: puzzlesm taking walks      [place] (10) - daughter   Social Determinants of Radio broadcast assistant Strain: Not on file  Food Insecurity: Not on file  Transportation Needs: Not on file  Physical Activity: Not on file  Stress: Not on file  Social Connections: Not on file  Intimate Partner Violence: Not on file    Review of Systems  Constitutional:  Negative for chills and fever.  Respiratory:  Negative for shortness of breath.   Cardiovascular:  Negative for chest pain and leg swelling.  Gastrointestinal:  Negative for abdominal pain, blood in stool, constipation, diarrhea, nausea and vomiting.       BM every other day   Genitourinary:  Negative for dysuria and hematuria.  Neurological:  Positive for headaches. Negative for tingling.  Psychiatric/Behavioral:  Negative for hallucinations and suicidal ideas.         Objective    BP 114/78   Pulse 67   Temp 97.8 F (36.6 C)   Resp 16   Ht 5\' 7"  (1.702 m)   Wt 214 lb (97.1 kg)   LMP 09/22/2022 (Approximate)   SpO2 97%   BMI 33.52 kg/m   Physical Exam Vitals and nursing note reviewed.  Constitutional:      Appearance: Normal appearance.  HENT:     Right Ear: Tympanic membrane, ear canal and external ear normal.     Left Ear: Tympanic membrane, ear canal and external ear normal.     Mouth/Throat:     Mouth: Mucous membranes are moist.     Pharynx: Oropharynx is clear.  Eyes:     Extraocular Movements: Extraocular movements intact.     Pupils: Pupils are equal, round, and reactive to light.  Cardiovascular:     Rate and Rhythm: Normal rate. Rhythm irregular.     Pulses: Normal pulses.     Heart sounds: Normal heart sounds.     Comments: Regularly irregular Pulmonary:     Effort: Pulmonary effort is normal.     Breath sounds: Normal breath sounds.  Abdominal:     General: Bowel sounds are normal. There is no distension.      Palpations: There is no mass.     Tenderness: There is no abdominal tenderness.     Hernia: No hernia is present.  Musculoskeletal:     Right lower leg: No edema.     Left lower leg: No edema.  Lymphadenopathy:     Cervical: No cervical adenopathy.  Skin:    General:  Skin is warm.  Neurological:     General: No focal deficit present.     Mental Status: She is alert.     Deep Tendon Reflexes:     Reflex Scores:      Bicep reflexes are 2+ on the right side and 2+ on the left side.      Patellar reflexes are 2+ on the right side and 2+ on the left side.    Comments: Bilateral upper and lower extremity strength 5/5  Psychiatric:        Mood and Affect: Mood normal.        Behavior: Behavior normal.        Thought Content: Thought content normal.        Judgment: Judgment normal.         Assessment & Plan:   Problem List Items Addressed This Visit       Respiratory   Nonallergic rhinitis    Followed by allergist.  Patient on Flonase, Astelin, Xyzal.  Continue taking medication as prescribed follow-up with specialist as recommended      Sinus trouble    Longstanding history of sinus trouble.  Was followed by ENT prior to moving to the area.  Ambulatory referral to ENT placed patient is followed by allergist      Relevant Orders   Ambulatory referral to ENT     Digestive   Gastroesophageal reflux disease    Patient is followed by [facility] gastro[facility]ology.  Dr. Michail Sermon.  Patient is on Protonix 40 mg daily and Pepcid on a as needed basis continue taking medication as prescribed follow-up with GI as recommended        Other   Irregular heart beat    Regular irregular heartbeat auscultated on exam.  EKG showed sinus bradycardia.  Did review signs and symptoms when to let office know.  Stable      Relevant Orders   TSH   EKG 12-Lead (Completed)   Preventative health care - Primary    Discussed age-appropriate immunizations and screening exams.  Patient is up-to-date on  tetanus vaccine and flu.  She is too young for CRC screening.  Pap smear recently done in 2021 and followed by GYN.  Patient was given information about preventative health care with anticipatory guidance for age range.      Relevant Orders   CBC   Lipid panel   Comprehensive metabolic panel   Hemoglobin A1c   TSH   Obesity (BMI 30-39.9)    Encouraged healthy lifestyle modifications inclusive of exercise 30 minutes a day 5 times a week      Relevant Orders   Lipid panel   Hemoglobin A1c   TSH   Long-term current use of proton pump inhibitor therapy    Patient on Protonix pending magnesium      Relevant Orders   Magnesium   Other Visit Diagnoses     Screening mammogram for breast cancer       Relevant Orders   MM 3D SCREEN BREAST BILATERAL       Return in about 1 year (around 10/06/2023) for CPE and Labs.   Romilda Garret, NP

## 2022-10-05 NOTE — Assessment & Plan Note (Addendum)
Longstanding history of sinus trouble.  Was followed by ENT prior to moving to the area.  Ambulatory referral to ENT placed patient is followed by allergist

## 2022-10-05 NOTE — Assessment & Plan Note (Signed)
Patient on Protonix pending magnesium

## 2022-10-07 ENCOUNTER — Encounter: Payer: Self-pay | Admitting: *Deleted

## 2022-10-07 NOTE — Telephone Encounter (Signed)
Paper filled out copied, faxed, and pt picked her her copy.

## 2022-11-28 IMAGING — MG MM DIGITAL SCREENING BILAT W/ TOMO AND CAD
8 series · 8 of 24 positions shown · non-contrast
Comparison: Previous exam(s).

CLINICAL DATA: Screening.

EXAM:
DIGITAL SCREENING BILATERAL MAMMOGRAM WITH TOMOSYNTHESIS AND CAD
TECHNIQUE: Bilateral screening digital craniocaudal and mediolateral oblique
mammograms were obtained. Bilateral screening digital breast
tomosynthesis was performed. The images were evaluated with
computer-aided detection.

[R CC synth-2D]
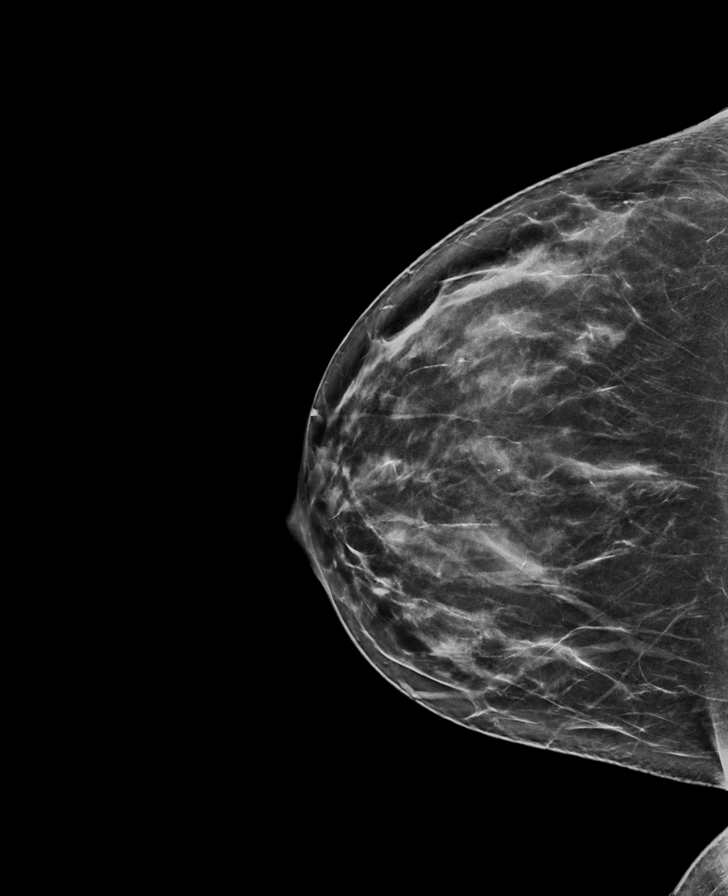

[L MLO synth-2D]
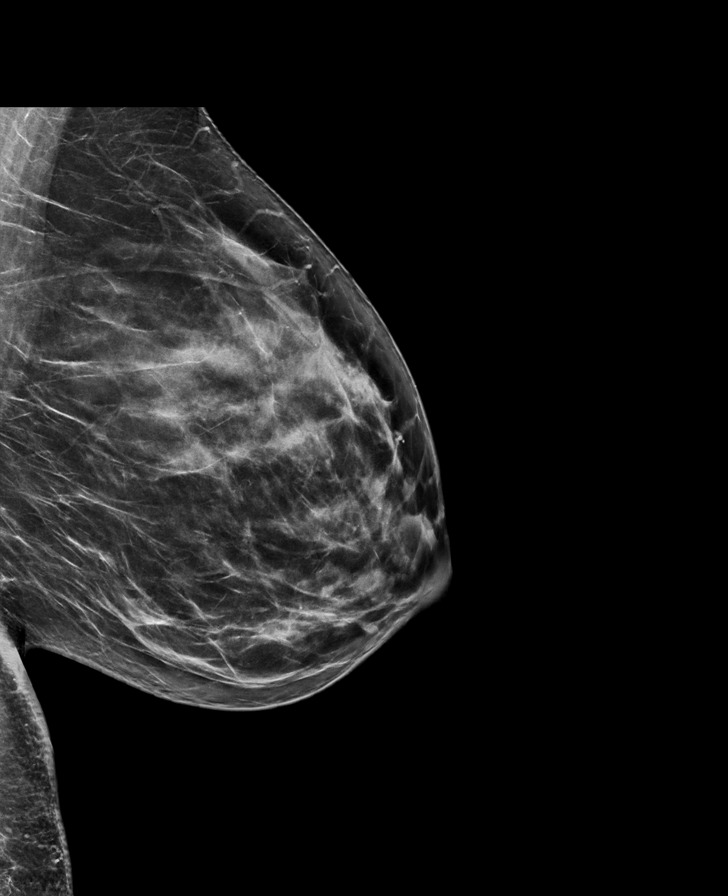

[R MLO synth-2D]
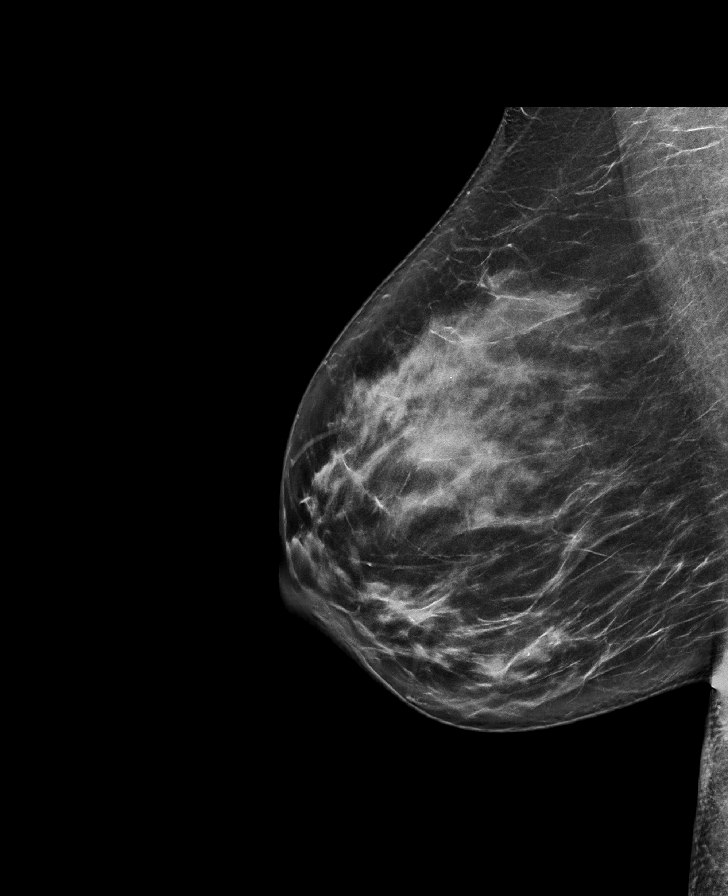

[L CC synth-2D]
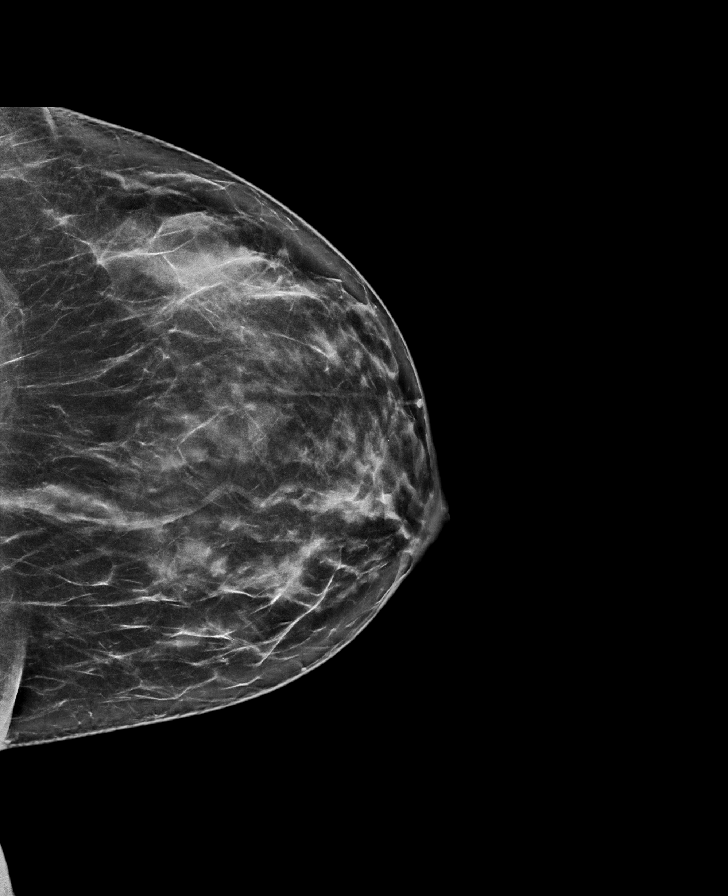

[R MLO tomo · tomo slice 41/81.0]
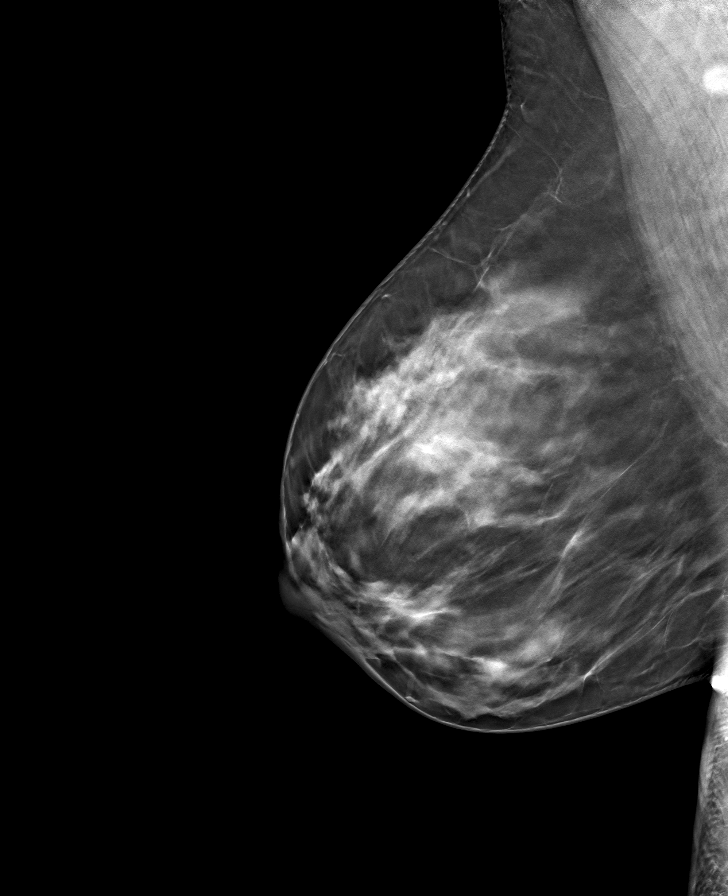

[R CC tomo · tomo slice 39/77.0]
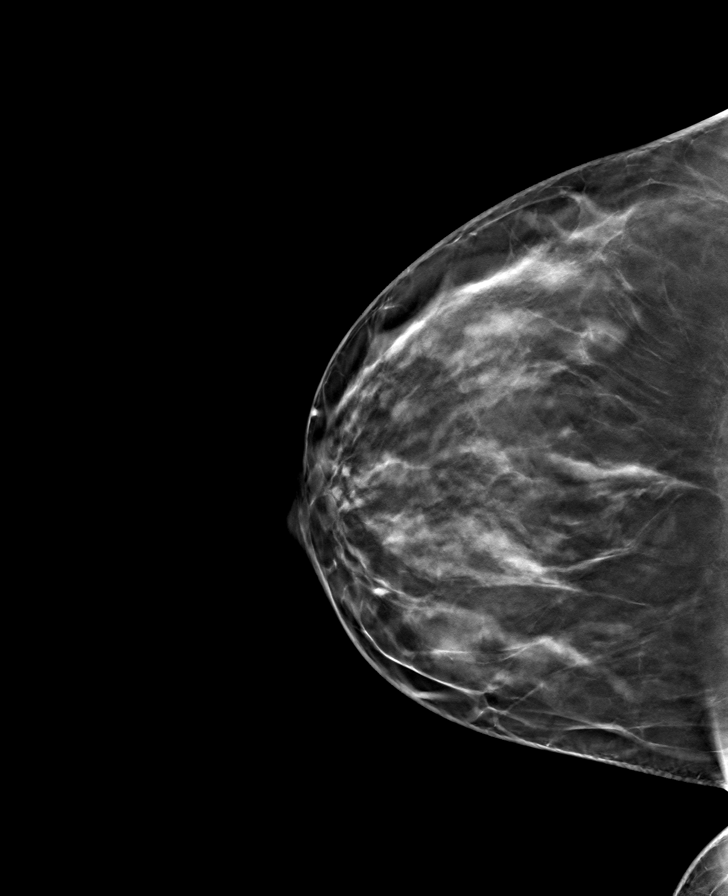

[L CC tomo · tomo slice 39/77.0]
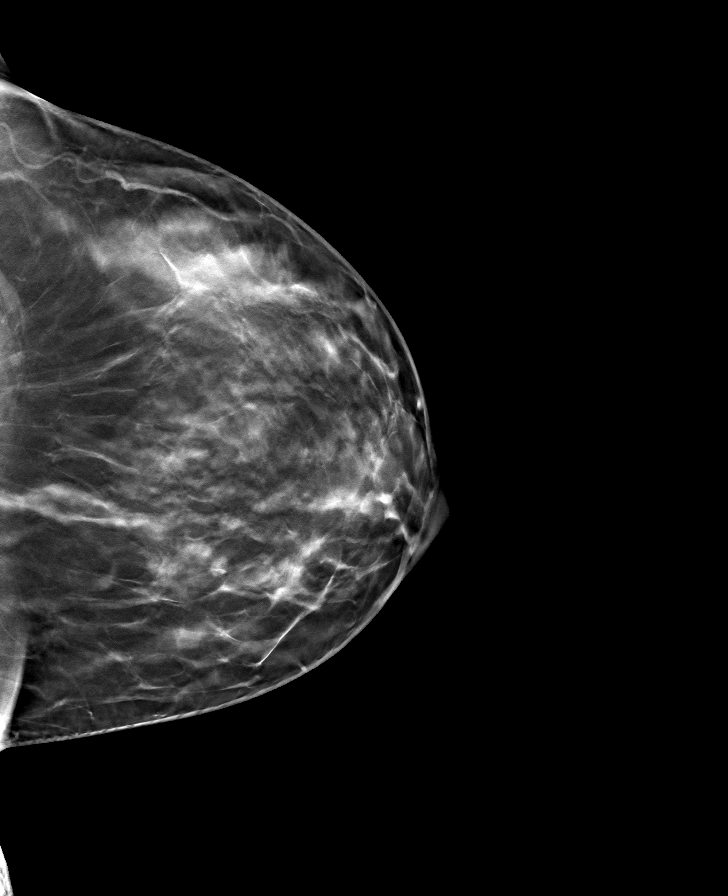

[L MLO tomo · tomo slice 39/76.0]
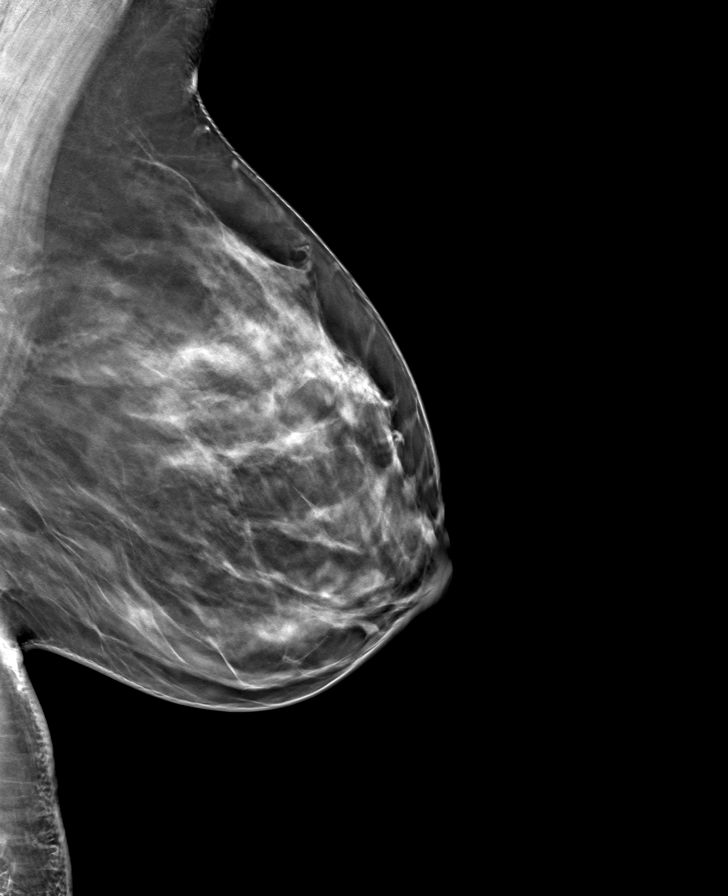

[8 of 24 positions shown; findings below may reference images not displayed]

ACR Breast Density Category c: The breast tissue is heterogeneously
dense, which may obscure small masses.
FINDINGS: There are no findings suspicious for malignancy.
IMPRESSION: No mammographic evidence of malignancy. A result letter of this
screening mammogram will be mailed directly to the patient.

RECOMMENDATION:
Screening mammogram in one year. (Code:Q3-W-BC3)

BI-RADS CATEGORY  1: Negative.

## 2023-01-21 ENCOUNTER — Ambulatory Visit
Admission: RE | Admit: 2023-01-21 | Discharge: 2023-01-21 | Disposition: A | Discharge status: Home / Self Care | Payer: No Typology Code available for payment source | Source: Ambulatory Visit | Attending: Nurse Practitioner | Admitting: Nurse Practitioner

## 2023-01-21 DIAGNOSIS — Z1231 Encounter for screening mammogram for malignant neoplasm of breast: Secondary | ICD-10-CM | POA: Insufficient documentation

## 2023-01-22 ENCOUNTER — Other Ambulatory Visit: Payer: Self-pay | Admitting: Nurse Practitioner

## 2023-01-22 DIAGNOSIS — R928 Other abnormal and inconclusive findings on diagnostic imaging of breast: Secondary | ICD-10-CM

## 2023-01-22 DIAGNOSIS — R921 Mammographic calcification found on diagnostic imaging of breast: Secondary | ICD-10-CM

## 2023-01-22 DIAGNOSIS — N6489 Other specified disorders of breast: Secondary | ICD-10-CM

## 2023-02-02 ENCOUNTER — Ambulatory Visit
Admission: RE | Admit: 2023-02-02 | Discharge: 2023-02-02 | Disposition: A | Discharge status: Home / Self Care | Payer: No Typology Code available for payment source | Source: Ambulatory Visit | Attending: Nurse Practitioner | Admitting: Nurse Practitioner

## 2023-02-02 DIAGNOSIS — N6489 Other specified disorders of breast: Secondary | ICD-10-CM | POA: Diagnosis present

## 2023-02-02 DIAGNOSIS — R928 Other abnormal and inconclusive findings on diagnostic imaging of breast: Secondary | ICD-10-CM

## 2023-02-02 DIAGNOSIS — R921 Mammographic calcification found on diagnostic imaging of breast: Secondary | ICD-10-CM | POA: Diagnosis present

## 2023-03-22 ENCOUNTER — Other Ambulatory Visit: Payer: Self-pay | Admitting: Obstetrics & Gynecology

## 2023-03-22 DIAGNOSIS — Z3041 Encounter for surveillance of contraceptive pills: Secondary | ICD-10-CM

## 2023-03-25 ENCOUNTER — Ambulatory Visit (INDEPENDENT_AMBULATORY_CARE_PROVIDER_SITE_OTHER): Payer: No Typology Code available for payment source | Admitting: Obstetrics & Gynecology

## 2023-03-25 ENCOUNTER — Encounter: Payer: Self-pay | Admitting: Obstetrics & Gynecology

## 2023-03-25 VITALS — BP 126/86 | HR 69 | Wt 208.0 lb

## 2023-03-25 DIAGNOSIS — Z3041 Encounter for surveillance of contraceptive pills: Secondary | ICD-10-CM | POA: Diagnosis not present

## 2023-03-25 DIAGNOSIS — G43829 Menstrual migraine, not intractable, without status migrainosus: Secondary | ICD-10-CM | POA: Diagnosis not present

## 2023-03-25 MED ORDER — NORETHIN ACE-ETH ESTRAD-FE 1-20 MG-MCG PO TABS: 1.0000 | ORAL_TABLET | Freq: Every day | ORAL | 6 refills | Status: DC

## 2023-03-25 MED ORDER — SUMATRIPTAN SUCCINATE 100 MG PO TABS
100.0000 mg | ORAL_TABLET | Freq: Once | ORAL | 11 refills | Status: AC | PRN
Start: 2023-03-25 — End: ?

## 2023-03-25 NOTE — Progress Notes (Signed)
   GYNECOLOGY OFFICE VISIT NOTE  History:   Stacy Barnes is a 47 y.o. G2P1011 here today for discussion of migraines that occur during her OCP placebo week.  Has been seen by Neurologist, wants to discuss using hormonal pills in a continuous fashion. No aura, not intractable. She denies any abnormal vaginal discharge, bleeding, pelvic pain or other concerns.    Past Medical History:  Diagnosis Date   Abnormal Pap smear of cervix    Anemia    h/o as a child   GERD (gastroesophageal reflux disease)    PONV (postoperative nausea and vomiting)    If under anesthesia for a long time pt gets very nauseated but has never vomited    Past Surgical History:  Procedure Laterality Date   CHOLECYSTECTOMY     DILATION AND CURETTAGE OF UTERUS  2011   FOOT FRACTURE SURGERY Right    PILONIDAL CYST EXCISION      The following portions of the patient's history were reviewed and updated as appropriate: allergies, current medications, past family history, past medical history, past social history, past surgical history and problem list.   Health Maintenance:  Normal pap and negative HRHPV on 12/05/2019.  Benign mammogram on 02/04/2023.   Review of Systems:  Pertinent items noted in HPI and remainder of comprehensive ROS otherwise negative.  Physical Exam:  BP 126/86   Pulse 69   Wt 208 lb (94.3 kg)   LMP 02/28/2023 (Approximate)   BMI 32.58 kg/m  CONSTITUTIONAL: Well-developed, well-nourished female in no acute distress.  HEENT:  Normocephalic, atraumatic. External right and left ear normal. No scleral icterus.  NECK: Normal range of motion, supple, no masses noted on observation SKIN: No rash noted. Not diaphoretic. No erythema. No pallor. MUSCULOSKELETAL: Normal range of motion. No edema noted. NEUROLOGIC: Alert and oriented to person, place, and time. Normal muscle tone coordination. No cranial nerve deficit noted. PSYCHIATRIC: Normal mood and affect. Normal behavior. Normal judgment and  thought content. CARDIOVASCULAR: Normal heart rate noted RESPIRATORY: Effort and breath sounds normal, no problems with respiration noted ABDOMEN: No masses noted. No other overt distention noted.   PELVIC: Deferred     Assessment and Plan:     1. Uses oral contraception 2. Menstrual migraine without status migrainosus, not intractable Discussed that the idea of continuous hormonal therapy is great in this instance. Will try this, new prescription written for her to reflect this change. Also prescribed Imitrex as needed for migraines. Will monitor response.  - norethindrone-ethinyl estradiol-FE (JUNEL FE 1/20) 1-20 MG-MCG tablet; Take 1 tablet by mouth daily. ONLY TAKE ACTIVE HORMONAL PILLS IN A CONTINUOUS FASHION. DO NOT TAKE PLACEBO PILLS.  Dispense: 84 tablet; Refill: 6 - SUMAtriptan (IMITREX) 100 MG tablet; Take 1 tablet (100 mg total) by mouth once as needed for up to 1 dose for migraine. May repeat in 2 hours if headache persists or recurs.  Dispense: 9 tablet; Refill: 11  Routine preventative health maintenance measures emphasized. Please refer to After Visit Summary for other counseling recommendations.   Return for any gynecologic concerns.    I spent 30 minutes dedicated to the care of this patient including pre-visit review of records, face to face time with the patient discussing her conditions and treatments and post visit orders.    Jaynie Collins, MD, FACOG Obstetrician & Gynecologist, [facility] for [employer], [facility]

## 2023-06-06 ENCOUNTER — Encounter: Payer: Self-pay | Admitting: Obstetrics & Gynecology

## 2023-06-07 ENCOUNTER — Other Ambulatory Visit: Payer: Self-pay | Admitting: Obstetrics & Gynecology

## 2023-06-07 DIAGNOSIS — Z3041 Encounter for surveillance of contraceptive pills: Secondary | ICD-10-CM

## 2023-06-07 DIAGNOSIS — G43829 Menstrual migraine, not intractable, without status migrainosus: Secondary | ICD-10-CM

## 2023-06-07 MED ORDER — NORETHIN ACE-ETH ESTRAD-FE 1-20 MG-MCG PO TABS
1.0000 | ORAL_TABLET | Freq: Every day | ORAL | 6 refills | Status: DC
Start: 2023-06-07 — End: 2024-06-29

## 2023-07-20 ENCOUNTER — Other Ambulatory Visit: Payer: Self-pay | Admitting: Nurse Practitioner

## 2023-07-20 DIAGNOSIS — R921 Mammographic calcification found on diagnostic imaging of breast: Secondary | ICD-10-CM

## 2023-08-17 ENCOUNTER — Ambulatory Visit
Admission: RE | Admit: 2023-08-17 | Discharge: 2023-08-17 | Disposition: A | Discharge status: Home / Self Care | Payer: No Typology Code available for payment source | Source: Ambulatory Visit | Attending: Nurse Practitioner | Admitting: Nurse Practitioner

## 2023-08-17 DIAGNOSIS — R921 Mammographic calcification found on diagnostic imaging of breast: Secondary | ICD-10-CM | POA: Diagnosis present

## 2023-10-08 LAB — HM COLONOSCOPY

## 2024-01-25 ENCOUNTER — Other Ambulatory Visit: Payer: Self-pay | Admitting: Nurse Practitioner

## 2024-01-25 DIAGNOSIS — R921 Mammographic calcification found on diagnostic imaging of breast: Secondary | ICD-10-CM

## 2024-01-25 DIAGNOSIS — R928 Other abnormal and inconclusive findings on diagnostic imaging of breast: Secondary | ICD-10-CM

## 2024-02-01 ENCOUNTER — Ambulatory Visit
Admission: RE | Admit: 2024-02-01 | Discharge: 2024-02-01 | Disposition: A | Discharge status: Home / Self Care | Source: Ambulatory Visit | Attending: Nurse Practitioner | Admitting: Nurse Practitioner

## 2024-02-01 DIAGNOSIS — R928 Other abnormal and inconclusive findings on diagnostic imaging of breast: Secondary | ICD-10-CM | POA: Insufficient documentation

## 2024-02-01 DIAGNOSIS — R921 Mammographic calcification found on diagnostic imaging of breast: Secondary | ICD-10-CM | POA: Diagnosis present

## 2024-06-28 ENCOUNTER — Other Ambulatory Visit: Payer: Self-pay | Admitting: Obstetrics & Gynecology

## 2024-06-28 DIAGNOSIS — Z3041 Encounter for surveillance of contraceptive pills: Secondary | ICD-10-CM

## 2024-06-28 DIAGNOSIS — G43829 Menstrual migraine, not intractable, without status migrainosus: Secondary | ICD-10-CM
